# Patient Record
Sex: Male | Born: 1945
Health system: Southern US, Community
[De-identification: ages and names within clinical notes are randomized; demographics above are authoritative.]

## PROBLEM LIST (undated history)

## (undated) DIAGNOSIS — C801 Malignant (primary) neoplasm, unspecified: Secondary | ICD-10-CM

## (undated) DIAGNOSIS — I779 Disorder of arteries and arterioles, unspecified: Secondary | ICD-10-CM

## (undated) HISTORY — PX: HERNIA REPAIR: SHX51

## (undated) HISTORY — PX: JOINT REPLACEMENT: SHX530

## (undated) HISTORY — PX: BACK SURGERY: SHX140

---

## 2017-03-15 ENCOUNTER — Emergency Department (HOSPITAL_BASED_OUTPATIENT_CLINIC_OR_DEPARTMENT_OTHER)
Admission: EM | Admit: 2017-03-15 | Discharge: 2017-03-15 | Disposition: A | Discharge status: Home / Self Care | Payer: Medicare Other | Attending: Emergency Medicine | Admitting: Emergency Medicine

## 2017-03-15 ENCOUNTER — Encounter (HOSPITAL_BASED_OUTPATIENT_CLINIC_OR_DEPARTMENT_OTHER): Payer: Self-pay | Admitting: Emergency Medicine

## 2017-03-15 DIAGNOSIS — S59911A Unspecified injury of right forearm, initial encounter: Secondary | ICD-10-CM | POA: Diagnosis present

## 2017-03-15 DIAGNOSIS — S41151A Open bite of right upper arm, initial encounter: Secondary | ICD-10-CM | POA: Diagnosis not present

## 2017-03-15 DIAGNOSIS — W540XXA Bitten by dog, initial encounter: Secondary | ICD-10-CM | POA: Diagnosis not present

## 2017-03-15 DIAGNOSIS — Z23 Encounter for immunization: Secondary | ICD-10-CM | POA: Insufficient documentation

## 2017-03-15 DIAGNOSIS — Y929 Unspecified place or not applicable: Secondary | ICD-10-CM | POA: Insufficient documentation

## 2017-03-15 DIAGNOSIS — F17210 Nicotine dependence, cigarettes, uncomplicated: Secondary | ICD-10-CM | POA: Diagnosis not present

## 2017-03-15 DIAGNOSIS — Y939 Activity, unspecified: Secondary | ICD-10-CM | POA: Insufficient documentation

## 2017-03-15 DIAGNOSIS — Y999 Unspecified external cause status: Secondary | ICD-10-CM | POA: Diagnosis not present

## 2017-03-15 HISTORY — DX: Malignant (primary) neoplasm, unspecified: C80.1

## 2017-03-15 MED ORDER — TETANUS-DIPHTH-ACELL PERTUSSIS 5-2.5-18.5 LF-MCG/0.5 IM SUSP
0.5000 mL | Freq: Once | INTRAMUSCULAR | Status: AC
  Administered 2017-03-15: 0.5 mL via INTRAMUSCULAR
  Filled 2017-03-15: qty 0.5

## 2017-03-15 MED ORDER — AMOXICILLIN-POT CLAVULANATE 875-125 MG PO TABS: 1.0000 | ORAL_TABLET | Freq: Two times a day (BID) | ORAL | 0 refills | Status: AC

## 2017-03-15 MED FILL — AMOX-CLAV 875-125 MG TABLET: 875-125 | 5 days supply | Qty: 10 | Fill #0

## 2017-03-15 NOTE — Discharge Instructions (Signed)
Please see the information and instructions below regarding your visit.  Your diagnoses today include:  1. Dog bite, initial encounter    Today we irrigated your wound and updated your tetanus shot. It is important may continue to take the antibiotic to prevent any infection from developing in this wound.  Tests performed today include: See side panel of your discharge paperwork for testing performed today. Vital signs are listed at the bottom of these instructions.   Medications prescribed:    1. Augmentin. Please take all of your antibiotics until finished.   You may develop abdominal discomfort or nausea from the antibiotic. If this occurs, you may take it with food. Some patients also get diarrhea with antibiotics. You may help offset this with probiotics which you can buy or get in yogurt. Do not eat or take the probiotics until 2 hours after your antibiotic.   Some people develop allergies to antibiotics. Symptoms of antibiotic allergy can be mild and include a flat rash and itching. They can also be more serious and include:  ?Hives - Hives are raised, red patches of skin that are usually very itchy.  ?Lip or tongue swelling  ?Trouble swallowing or breathing  ?Blistering of the skin or mouth.  If you have any of these serious symptoms, please seek emergency medical care immediately.    Take any prescribed medications only as prescribed, and any over the counter medications only as directed on the packaging.  Home care instructions:  Please follow any educational materials contained in this packet.   Please wash your wound with mild soap and water only. Please pattern dry after it is wet, and redress with a dry dressing. If there is pain or swelling, you may apply ice on top of the bandage.  Follow-up instructions: Please follow-up with your primary care provider in 2 days for further evaluation of your symptoms if they are not completely improved. Please have this wound  looked at early next week.  Return instructions:  Please return to the Emergency Department if you experience worsening symptoms.  Please return for any increasing redness around your wound, pus, increased bleeding, numbness or tingling down her arm, increased swelling, or weakness in your arm. Please return if you have any other emergent concerns.  Additional Information: Please have your blood pressure rechecked at your upcoming visit.  Your vital signs today were: BP (!) 175/79 (BP Location: Left Arm)    Pulse 78    Temp 98 F (36.7 C) (Oral)    Resp 18    Ht 6\' 4"  (1.93 m)    Wt 96.2 kg (212 lb)    SpO2 100%    BMI 25.81 kg/m  If your blood pressure (BP) was elevated on multiple readings during this visit above 130 for the top number or above 80 for the bottom number, please have this repeated by your primary care provider within one month. --------------  Thank you for allowing [place] to participate in your care today.

## 2017-03-15 NOTE — ED Provider Notes (Signed)
[facility] Provider Note   CSN: [id number] Arrival date & time: 03/15/17  1027     History   Chief Complaint Chief Complaint  Patient presents with  . Animal Bite    HPI Danny Hall is a 71 y.o. male.  HPI   Patient is a 71 year old male presenting for a dog bite a known dog earlier this morning. Patient reports that this was an unprovoked attack. Patient reports that he has known this dog for approximately 3 weeks but was suddenly bitten on the right arm and the dog knocked patient down. Associated injuries include abrasions on the left arm, but no other trauma during this episode. Patient does not take blood thinners. Patient denies any swelling, numbness, tingling, or weakness down the right arm. Patient is not a diabetic. Patient does not recall last tetanus shot.   . Diagnosis Date  . Cancer ([place])     There are no active problems to display for this patient.   Past Surgical History:  Procedure Laterality Date  . BACK SURGERY    . HERNIA REPAIR         Home Medications    Prior to Admission medications   Not on File    Family History History reviewed. No pertinent family history.  Social History Social History  Substance Use Topics  . Smoking status: Current Every Day Smoker    Packs/day: 2.00    Types: Cigarettes  . Smokeless tobacco: Never Used  . Alcohol use No     Allergies   Patient has no known allergies.   Review of Systems Review of Systems  Constitutional: Negative for chills and fever.  Respiratory: Negative for shortness of breath.   Cardiovascular: Negative for chest pain.  Musculoskeletal: Negative for joint swelling.  Skin: Positive for wound.  Neurological: Negative for weakness and numbness.     Physical Exam Updated Vital Signs BP (!) 175/79 (BP Location: Left Arm)   Pulse 78   Temp 98 F (36.7 C) (Oral)   Resp 18   Ht 6\' 4"  (1.93 m)   Wt 96.2 kg (212 lb)   SpO2 100%   BMI 25.81 kg/m    Physical Exam  Constitutional: He appears well-developed and well-nourished. No distress.  Sitting comfortably in bed.  HENT:  Head: Normocephalic and atraumatic.  Eyes: Conjunctivae are normal. Right eye exhibits no discharge. Left eye exhibits no discharge.  EOMs normal to gross examination.  Neck: Normal range of motion.  Cardiovascular: Normal rate and regular rhythm.   Intact, 2+ radial pulse on right.   Pulmonary/Chest:  Normal respiratory effort. Patient converses comfortably. No audible wheeze or stridor.  Abdominal: He exhibits no distension.  Musculoskeletal: Normal range of motion.  Neurological: He is alert.  Cranial nerves intact to gross observation. Patient moves extremities without difficulty.  Skin: Skin is warm and dry. He is not diaphoretic.  Approximately 2 cm area of avulsed skin down to fascial layer of antecubital fossa. Brachial artery intact. Sensation to light touch intact in median, ulnar, radial nerve distributions. Grip strength 5 out of 5, and wrist extension 5 out of 5 on right.   Psychiatric: He has a normal mood and affect. His behavior is normal. Judgment and thought content normal.  Nursing note and vitals reviewed.    ED Treatments / Results  Labs (all labs ordered are listed, but only abnormal results are displayed) Labs Reviewed - No data to display  EKG  EKG Interpretation None  Radiology No results found.  Procedures Procedures (including critical care time)  Medications Ordered in ED Medications - No data to display   Initial Impression / Assessment and Plan / ED Course  I have reviewed the triage vital signs and the nursing notes.  Pertinent labs & imaging results that were available during my care of the patient were reviewed by me and considered in my medical decision making (see chart for details).     Final Clinical Impressions(s) / ED Diagnoses   Final diagnoses:  None   Patient is a 71 year old male  presenting for a dog bite a known dog earlier this morning. This animal has had full vaccination series for rabies. Wound cleaned in emergency department today with 500 cc of NS. Exam was not suggestive of neurovascular compromise due to this bite. Patient is otherwise well-appearing. Tetanus updated in the emergency department today and prophylactic Augmentin prescribed. Return precautions given for any spreading erythema, streaking of the arm, purulent drainage, numbness or tingling in the distal right hand, weakness in the right arm, or fever or chills in the setting of this wound. Patient is in understanding and agrees with the plan of care.   This is a shared visit with Dr. Jola Schmidt. Patient was independently evaluated by this attending physician. Attending physician consulted in evaluation and discharge management.  Nursing notes reviewed. Vital signs reviewed. All questions answered by patient.  New Prescriptions New Prescriptions   No medications on file     Tamala Julian 03/15/17 Alfredia Ferguson, MD 03/16/17 [phone]

## 2017-03-15 NOTE — ED Triage Notes (Addendum)
Reports friends dog got out of the car and bit patient.  Open puncture wound noted to right upper arm.  No active bleeding.

## 2017-03-19 ENCOUNTER — Encounter (HOSPITAL_BASED_OUTPATIENT_CLINIC_OR_DEPARTMENT_OTHER): Payer: Self-pay | Admitting: Emergency Medicine

## 2017-03-19 ENCOUNTER — Emergency Department (HOSPITAL_BASED_OUTPATIENT_CLINIC_OR_DEPARTMENT_OTHER)
Admission: EM | Admit: 2017-03-19 | Discharge: 2017-03-19 | Disposition: A | Discharge status: Home / Self Care | Payer: Medicare Other | Attending: Emergency Medicine | Admitting: Emergency Medicine

## 2017-03-19 ENCOUNTER — Emergency Department (HOSPITAL_BASED_OUTPATIENT_CLINIC_OR_DEPARTMENT_OTHER): Payer: Medicare Other

## 2017-03-19 DIAGNOSIS — F17210 Nicotine dependence, cigarettes, uncomplicated: Secondary | ICD-10-CM | POA: Diagnosis not present

## 2017-03-19 DIAGNOSIS — W540XXA Bitten by dog, initial encounter: Secondary | ICD-10-CM | POA: Diagnosis not present

## 2017-03-19 DIAGNOSIS — Z79899 Other long term (current) drug therapy: Secondary | ICD-10-CM | POA: Diagnosis not present

## 2017-03-19 DIAGNOSIS — Z859 Personal history of malignant neoplasm, unspecified: Secondary | ICD-10-CM | POA: Insufficient documentation

## 2017-03-19 DIAGNOSIS — Z4801 Encounter for change or removal of surgical wound dressing: Secondary | ICD-10-CM | POA: Diagnosis not present

## 2017-03-19 DIAGNOSIS — Z5189 Encounter for other specified aftercare: Secondary | ICD-10-CM

## 2017-03-19 MED ORDER — MUPIROCIN CALCIUM 2 % EX CREA: 1.0000 "application " | TOPICAL_CREAM | Freq: Two times a day (BID) | CUTANEOUS | 0 refills | Status: AC

## 2017-03-19 NOTE — ED Triage Notes (Signed)
"   I was here for a dog bite on Monday and this am it has gotten sore and draining" Swelling to RAC area, taking antibiotics as prescribed

## 2017-03-19 NOTE — Discharge Instructions (Signed)
Your exam and ultrasound today did not show evidence of deep infection. It appears to be in the process of healing. We are adding a topical cream antibiotic to help with your antibiotics you are already taking. Please follow-up with your primary care physician for further management. If any symptoms change or worsen, please return to the nearest emergency department.

## 2017-03-19 NOTE — ED Provider Notes (Signed)
[facility] Provider Note   CSN: [id number] Arrival date & time: 03/19/17  0757     History   Chief Complaint Chief Complaint  Patient presents with  . Wound Check    HPI Danny Hall is a 71 y.o. male.  The history is provided by the patient and medical records.  Wound Check  This is a new problem. The current episode started more than 2 days ago. The problem occurs constantly. The problem has been gradually worsening. Pertinent negatives include no chest pain, no abdominal pain, no headaches and no shortness of breath. Nothing aggravates the symptoms. Nothing relieves the symptoms. He has tried nothing for the symptoms. The treatment provided no relief.    Past Medical History:  Diagnosis Date  . Cancer ([place])     There are no active problems to display for this patient.   Past Surgical History:  Procedure Laterality Date  . BACK SURGERY    . HERNIA REPAIR         Home Medications    Prior to Admission medications   Medication Sig Start Date End Date Taking? Authorizing Provider  amoxicillin-clavulanate (AUGMENTIN) 875-125 MG tablet Take 1 tablet by mouth every 12 (twelve) hours. 03/15/17 03/20/17 Yes Albesa Seen, PA-C    Family History No family history on file.  Social History Social History  Substance Use Topics  . Smoking status: Current Every Day Smoker    Packs/day: 2.00    Types: Cigarettes  . Smokeless tobacco: Never Used  . Alcohol use No     Allergies   Patient has no known allergies.   Review of Systems Review of Systems  Constitutional: Negative for chills, diaphoresis, fatigue and fever.  HENT: Negative for congestion and rhinorrhea.   Respiratory: Negative for chest tightness, shortness of breath and wheezing.   Cardiovascular: Negative for chest pain, palpitations and leg swelling.  Gastrointestinal: Negative for abdominal pain, constipation, diarrhea, nausea and vomiting.  Genitourinary: Negative for dysuria.    Musculoskeletal: Negative for back pain, neck pain and neck stiffness.  Skin: Positive for rash and wound.  Neurological: Negative for light-headedness and headaches.  Psychiatric/Behavioral: Negative for agitation and confusion.  All other systems reviewed and are negative.    Physical Exam Updated Vital Signs BP (!) 156/65 (BP Location: Left Arm)   Pulse 72   Temp 98.1 F (36.7 C) (Oral)   Resp 18   Ht 6\' 4"  (1.93 m)   Wt 96.2 kg (212 lb)   SpO2 100%   BMI 25.81 kg/m   Physical Exam  Constitutional: He appears well-developed and well-nourished. No distress.  HENT:  Head: Normocephalic and atraumatic.  Mouth/Throat: Oropharynx is clear and moist. No oropharyngeal exudate.  Eyes: Conjunctivae are normal.  Neck: Neck supple.  Cardiovascular: Normal rate and regular rhythm.   No murmur heard. Pulmonary/Chest: Effort normal and breath sounds normal. No respiratory distress. He has no wheezes. He has no rales. He exhibits no tenderness.  Abdominal: Soft. There is no tenderness.  Musculoskeletal: He exhibits tenderness. He exhibits no edema.       Right elbow: He exhibits laceration. He exhibits normal range of motion, no effusion and no deformity. Tenderness found.       Arms: Neurological: He is alert. No sensory deficit.  Skin: Skin is warm and dry. Capillary refill takes less than 2 seconds. He is not diaphoretic. There is erythema. No pallor.  Psychiatric: He has a normal mood and affect.  Nursing note and vitals  reviewed.    ED Treatments / Results  Labs (all labs ordered are listed, but only abnormal results are displayed) Labs Reviewed - No data to display  EKG  EKG Interpretation None       Radiology Dg Elbow Complete Right  Result Date: 03/19/2017 CLINICAL DATA:  Dog bite 4 days ago. EXAM: RIGHT ELBOW - COMPLETE 3+ VIEW COMPARISON:  None. FINDINGS: There is no evidence of fracture, dislocation, or joint effusion. There is no evidence of arthropathy or  other focal bone abnormality. Soft tissues are unremarkable. IMPRESSION: Negative. Electronically Signed   By: Rolm Baptise M.D.   On: 03/19/2017 09:22    Procedures Procedures (including critical care time)  EMERGENCY DEPARTMENT US SOFT TISSUE INTERPRETATION "Study: Limited Soft Tissue Ultrasound"  INDICATIONS: Soft tissue infection Multiple views of the body part were obtained in real-time with a multi-frequency linear probe  PERFORMED BY: Myself IMAGES ARCHIVED?: Yes SIDE:Right  BODY PART:Upper extremity INTERPRETATION:  No abcess noted and No cellulitis noted     Medications Ordered in ED Medications - No data to display   Initial Impression / Assessment and Plan / ED Course  I have reviewed the triage vital signs and the nursing notes.  Pertinent labs & imaging results that were available during my care of the patient were reviewed by me and considered in my medical decision making (see chart for details).     Danny Hall is a 71 y.o. male with a past medical history significant for left antecubital fossa dog bite 4 days ago who presents with arm pain. Patient says that he was bit by dog 4 days ago and seen at this facility. Patient was started on Augmentin and he has been taking them. He says that he was having no trouble with the arm until today when he noticed swelling, more redness, and pain. Patient denies systemic signs of infection such as fevers, chills, nausea, vomiting, or other complaints. He denies any pain in his hand or distal to the bites. He denies arm swelling distal to the bite. He denies any numbness, tingling, or weakness of the arm. He denies other complaints.  On exam, patient has multiple laceration areas on his right antecubital fossa. There is some surrounding erythema and very mild tenderness. No significant swelling or bulge is seen. Patient has symmetric and normal radial and ulnar pulses. Normal grip strength. Normal sensation in all  distributions of the hand. Lungs clear. Mild pain with elbow manipulation.   Patient says that he did not get imaging during his last visit, will obtain x-ray to look for retained foreign body or subcutaneous air. Patient will also have bedside ultrasound performed to look for abscess.  Anticipate reassessment after imaging.  9:54 AM X-ray shows no evidence of subcutaneous air or retained foreign body. No evidence of osteo-myelitis.  Bedside ultrasound was performed showing no evidence of abscess.  Suspect continued healing of dog bite. Pt has no signs of systemic infection or worsening infection. Patient will have mupirocin cream added to the Augmentin and he will continue this. Patient will follow-up with PCP for reassessment. Patient given return precautions for any new or worsened symptoms. Patient had no other questions or concerns. Patient discharged in good condition.   Final Clinical Impressions(s) / ED Diagnoses   Final diagnoses:  Visit for wound check    New Prescriptions New Prescriptions   MUPIROCIN CREAM (BACTROBAN) 2 %    Apply 1 application topically 2 (two) times daily.    Clinical  Impression: 1. Visit for wound check     Disposition: Discharge  Condition: Good  I have discussed the results, Dx and Tx plan with the pt(& family if present). He/she/they expressed understanding and agree(s) with the plan. Discharge instructions discussed at great length. Strict return precautions discussed and pt &/or family have verbalized understanding of the instructions. No further questions at time of discharge.    New Prescriptions   MUPIROCIN CREAM (BACTROBAN) 2 %    Apply 1 application topically 2 (two) times daily.    Follow Up: [facility] [id number] [phone]     [facility] [place] [facility] DEPARTMENT [address] [id number] [place] [address] [phone]  If symptoms worsen       Tegeler, Gwenyth Allegra, MD 03/19/17 [phone]

## 2017-03-19 NOTE — ED Notes (Signed)
Patient transported to X-ray 

## 2019-04-02 IMAGING — DX DG ELBOW COMPLETE 3+V*R*
4 series · 4 of 4 positions shown · non-contrast
Comparison: None.

CLINICAL DATA: Dog bite 4 days ago.

EXAM:
RIGHT ELBOW - COMPLETE 3+ VIEW

[elbow ap]
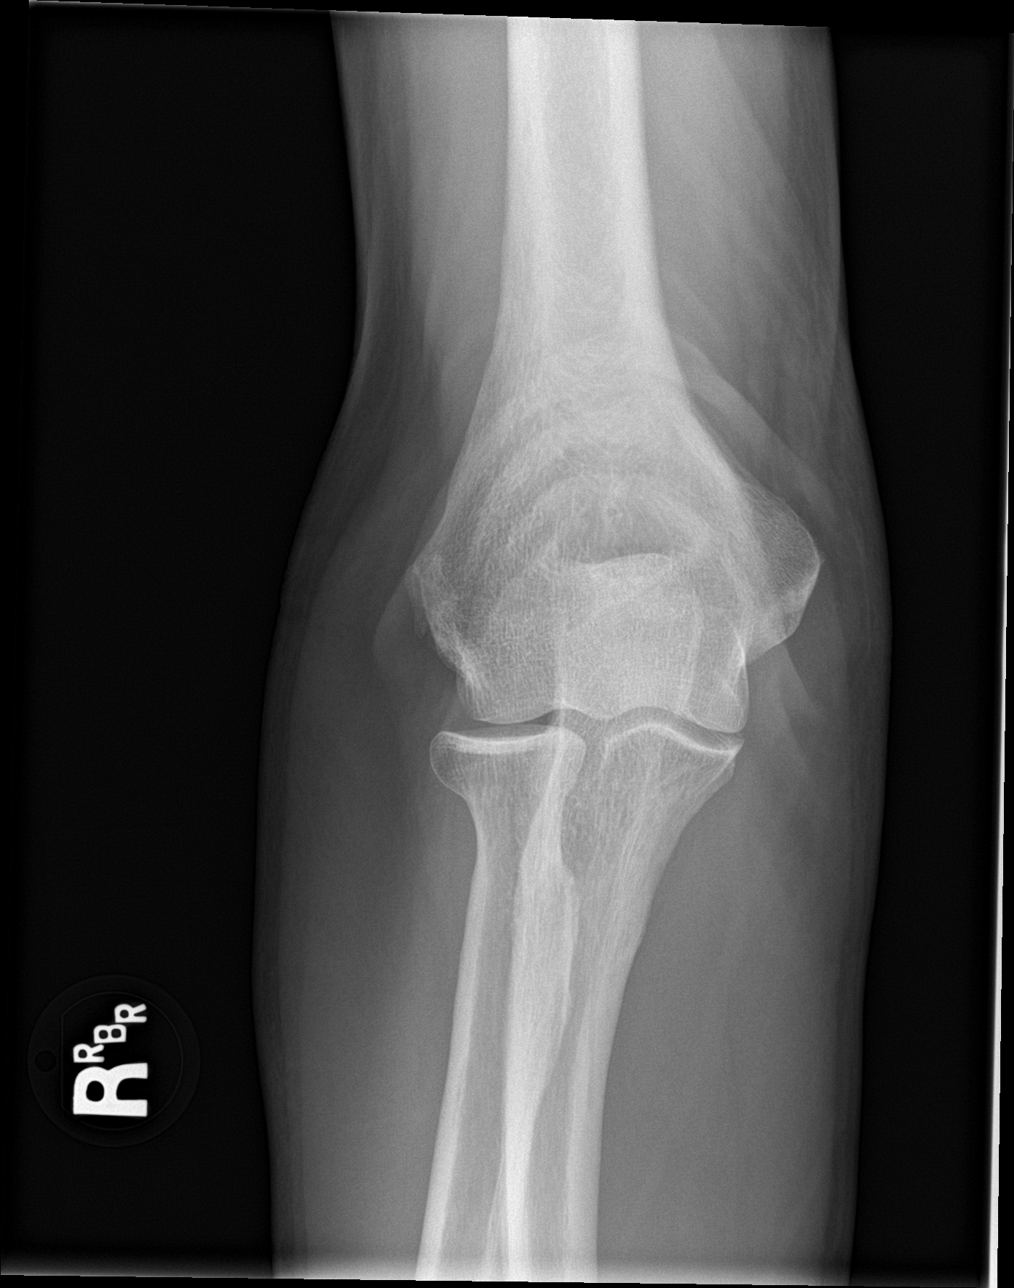

[elbow obl (1 of 2)]
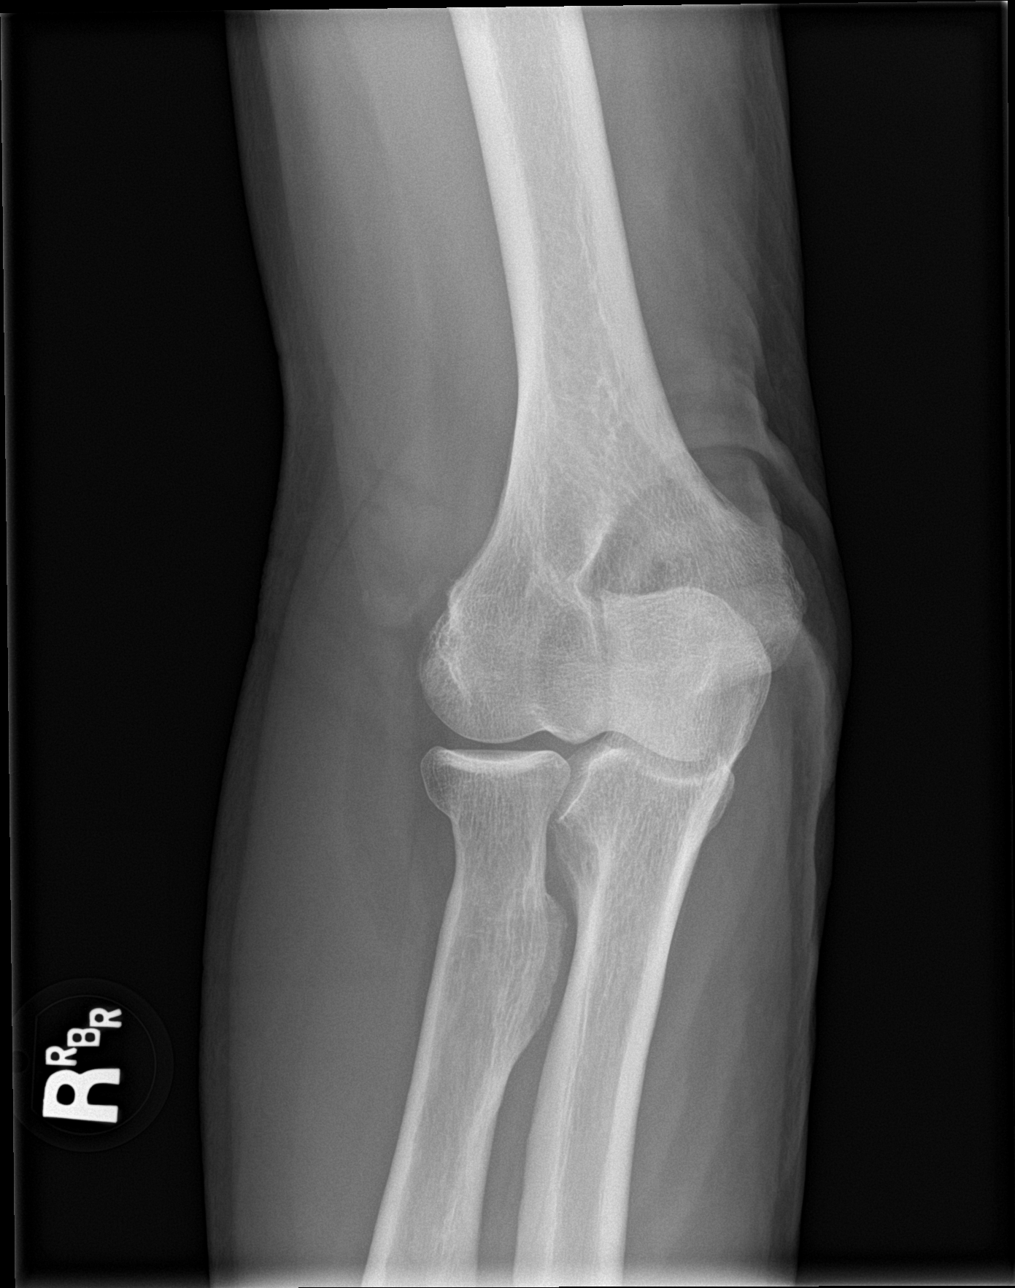

[elbow obl (2 of 2)]
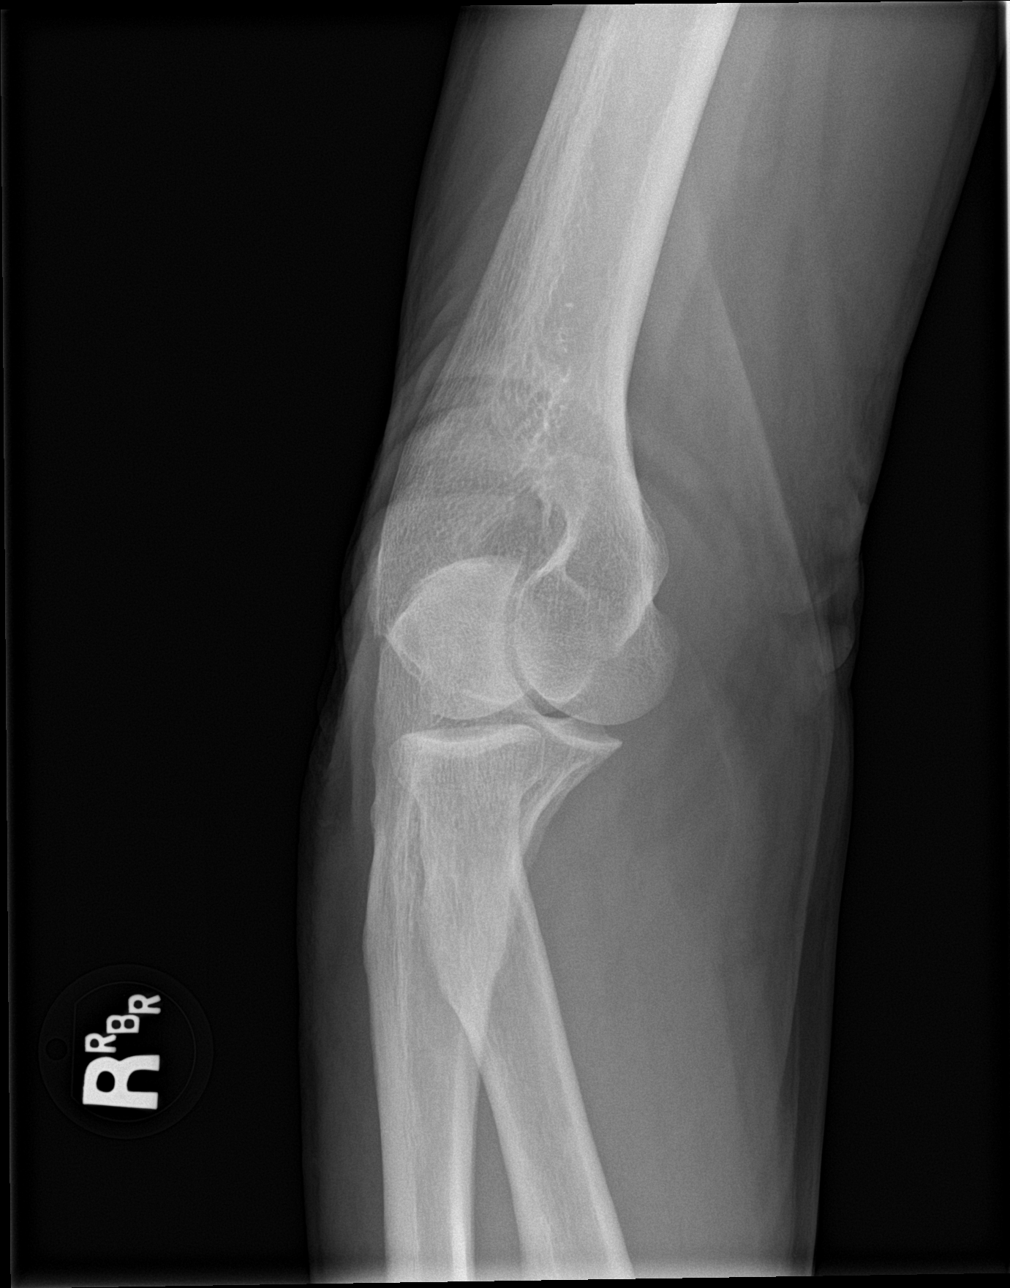

[elbow lat]
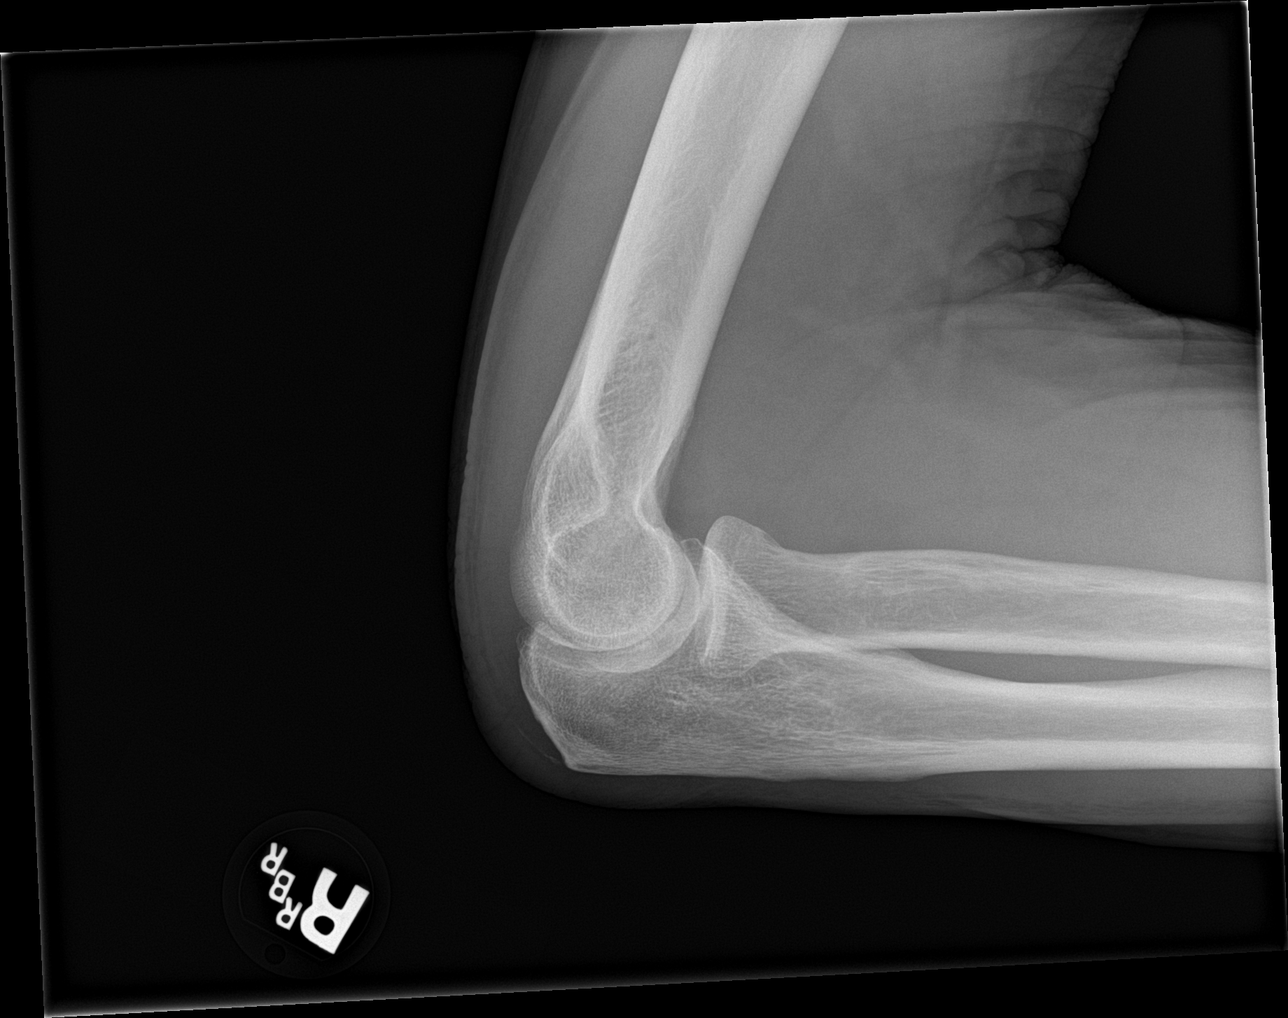

[4 of 4 positions shown; findings below may reference images not displayed]

FINDINGS: There is no evidence of fracture, dislocation, or joint effusion.
There is no evidence of arthropathy or other focal bone abnormality.
Soft tissues are unremarkable.
IMPRESSION: Negative.

## 2020-06-22 DIAGNOSIS — Z20828 Contact with and (suspected) exposure to other viral communicable diseases: Secondary | ICD-10-CM | POA: Diagnosis not present

## 2021-02-05 DIAGNOSIS — J069 Acute upper respiratory infection, unspecified: Secondary | ICD-10-CM | POA: Diagnosis not present

## 2021-02-05 DIAGNOSIS — Z20828 Contact with and (suspected) exposure to other viral communicable diseases: Secondary | ICD-10-CM | POA: Diagnosis not present

## 2021-02-05 DIAGNOSIS — J209 Acute bronchitis, unspecified: Secondary | ICD-10-CM | POA: Diagnosis not present

## 2021-07-22 DIAGNOSIS — I639 Cerebral infarction, unspecified: Secondary | ICD-10-CM

## 2021-07-22 HISTORY — DX: Cerebral infarction, unspecified: I63.9

## 2021-07-23 DIAGNOSIS — I639 Cerebral infarction, unspecified: Secondary | ICD-10-CM

## 2021-07-23 HISTORY — DX: Cerebral infarction, unspecified: I63.9

## 2021-08-05 ENCOUNTER — Ambulatory Visit (HOSPITAL_COMMUNITY)
Admission: RE | Admit: 2021-08-05 | Discharge: 2021-08-05 | Disposition: A | Discharge status: Home / Self Care | Payer: No Typology Code available for payment source | Source: Ambulatory Visit | Attending: Vascular Surgery | Admitting: Vascular Surgery

## 2021-08-05 ENCOUNTER — Other Ambulatory Visit: Payer: Self-pay

## 2021-08-05 ENCOUNTER — Ambulatory Visit (INDEPENDENT_AMBULATORY_CARE_PROVIDER_SITE_OTHER): Payer: No Typology Code available for payment source | Admitting: Vascular Surgery

## 2021-08-05 ENCOUNTER — Other Ambulatory Visit (HOSPITAL_COMMUNITY): Payer: Self-pay | Admitting: Vascular Surgery

## 2021-08-05 ENCOUNTER — Encounter: Payer: Self-pay | Admitting: Vascular Surgery

## 2021-08-05 VITALS — BP 123/78 | HR 85 | Temp 98.3°F | Resp 20 | Ht 76.0 in | Wt 214.6 lb

## 2021-08-05 DIAGNOSIS — I6529 Occlusion and stenosis of unspecified carotid artery: Secondary | ICD-10-CM | POA: Diagnosis present

## 2021-08-05 DIAGNOSIS — I6521 Occlusion and stenosis of right carotid artery: Secondary | ICD-10-CM

## 2021-08-05 MED ORDER — CLOPIDOGREL BISULFATE 75 MG PO TABS: 75.0000 mg | ORAL_TABLET | Freq: Every day | ORAL | 2 refills | Status: AC

## 2021-08-05 NOTE — Progress Notes (Unsigned)
Opened in error

## 2021-08-05 NOTE — Progress Notes (Signed)
[facility] [place] [facility]  ASSESSMENT / PLAN: Danny Hall is a 76 y.o. 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   The patient's carotid artery stenosis merits consideration of revascularization to reduce the risk of future stroke because of carotid stenosis >50% with symptoms of TIA/CVA attributable to carotid lesion. I quoted the patient risk reduction from intervention of ~17% for symptomatic stenosis based on data from the NASCET trial.  The patient should continue best medical therapy for carotid artery stenosis including: Complete cessation from all tobacco products. Blood glucose control with goal A1c < 7%. Blood pressure control with goal blood pressure < 140/90 mmHg. Lipid reduction therapy with goal LDL-C <100 mg/dL (<70 if symptomatic from carotid artery stenosis).  Aspirin 81mg  PO QD.  Clopidogrel 75mg  PO QD. Atorvastatin 40-80mg  PO QD (or other "high intensity" statin therapy).  Patient would benefit from right TCAR given the extent of the disease into the mid internal carotid artery on CT angiography done at the [place].  The reading radiologist transposed the laterality of the disease.  Carotid duplex in my office confirms right-sided severe stenosis. R TCAR 08/14/2021.  We will start Plavix today.  Continue dual antiplatelet therapy until surgery.  CHIEF COMPLAINT: Recent TIA  HISTORY OF PRESENT ILLNESS: Danny Hall is a 76 y.o. male who presents to clinic for evaluation of recent TIA.  The patient reports left-sided numbness in the arm and leg followed by a visual disturbance.  He reported this to his primary care physician who instructed him report to the ER.  Stroke work-up was initiated.  Thankfully his symptoms resolved.  CT angiogram reveals severe right carotid artery stenosis.  He somewhat different symptoms to the vascular surgeon at the [facility].  He was reporting mostly dizziness at that time.  He did not report the visual disturbances  or unilateral numbness.  I think for that reason, the stenosis was not felt to be symptomatic.  Outpatient referral for severe stenosis was recommended.    Past Medical History:  Diagnosis Date   Cancer [facility])     Past Surgical History:  Procedure Laterality Date   BACK SURGERY     HERNIA REPAIR      History reviewed. No pertinent family history.  Social History   Socioeconomic History   Marital status: Married    Spouse name: Not on file   Number of children: Not on file   Years of education: Not on file   Highest education level: Not on file  Occupational History   Not on file  Tobacco Use   Smoking status: Every Day    Packs/day: 2.00    Types: Cigarettes   Smokeless tobacco: Never  Substance and Sexual Activity   Alcohol use: No   Drug use: No   Sexual activity: Not on file  Other Topics Concern   Not on file  Social History Narrative   Not on file   Social Determinants of Health   Financial Resource Strain: Not on file  Food Insecurity: Not on file  Transportation Needs: Not on file  Physical Activity: Not on file  Stress: Not on file  Social Connections: Not on file  Intimate Partner Violence: Not on file    No Known Allergies  Current Outpatient Medications  Medication Sig Dispense Refill   aspirin 325 MG tablet TAKE ONE TABLET BY MOUTH DAILY FOR HEART     atorvastatin (LIPITOR) 40 MG tablet TAKE ONE TABLET BY MOUTH AT BEDTIME  FOR CHOLESTEROL     clopidogrel (PLAVIX) 75 MG tablet Take 1 tablet (75 mg total) by mouth daily. 30 tablet 2   vitamin B-12 (CYANOCOBALAMIN) 500 MCG tablet Take 2 tablets by mouth daily.     nicotine (NICODERM CQ - DOSED IN MG/24 HOURS) 14 mg/24hr patch Place 14 mg onto the skin daily. (Patient not taking: Reported on 08/05/2021)     nicotine (NICODERM CQ - DOSED IN MG/24 HOURS) 21 mg/24hr patch Place 21 mg onto the skin daily. (Patient not taking: Reported on 08/05/2021)     nicotine (NICODERM CQ - DOSED IN MG/24 HR) 7 mg/24hr  patch APPLY 1 PATCH TO SKIN ONCE A DAY FOR NICOTINE REPLACEMENT THERAPY *APPLY TO NON-HAIRY, CLEAN, DRY AREA (NO TOBACCO PRODUCTS)* FOR 2 WEEKS - THIS IS STEP 2 - BEGIN AFTER 21MG  PATCHES COMPLETE (Patient not taking: Reported on 08/05/2021)     No current facility-administered medications for this visit.    PHYSICAL EXAM Vitals:   08/05/21 1340  BP: 123/78  Pulse: 85  Resp: 20  Temp: 98.3 F (36.8 C)  SpO2: 98%  Weight: 214 lb 9.6 oz (97.3 kg)  Height: 6\' 4"  (1.93 m)    Constitutional: well appearing. no distress. Appears well nourished.  Neurologic: CN intact. no focal findings. no sensory loss. Psychiatric:  Mood and affect symmetric and appropriate. Eyes:  No icterus. No conjunctival pallor. Ears, nose, throat:  mucous membranes moist. Midline trachea.  Cardiac: regular rate and rhythm.  Respiratory:  unlabored. Abdominal:  soft, non-tender, non-distended.  Peripheral vascular: 2+ radial pulses Extremity: no edema. no cyanosis. no pallor.  Skin: no gangrene. no ulceration.  Lymphatic: no Stemmer's sign. no palpable lymphadenopathy.  PERTINENT LABORATORY AND RADIOLOGIC DATA Carotid duplex 08/14/21. Personally reviewed.  Right Carotid: Velocities in the right ICA are consistent with a 80-99%                 stenosis.   Left Carotid: Velocities in the left ICA are consistent with a 40-59%  stenosis.   Vertebrals:  Bilateral vertebral arteries demonstrate antegrade flow.  Subclavians: Normal flow hemodynamics were seen in bilateral subclavian               arteries.   [name]. Stanford Breed, MD [facility] Phone Number: [phone] 08/05/2021 5:25 PM  Total time spent on preparing this encounter including chart review, data review, collecting history, examining the patient, coordinating care for this new patient, 60 minutes.  Portions of this report may have been transcribed using voice recognition software.  Every effort has been  made to ensure accuracy; however, inadvertent computerized transcription errors may still be present.

## 2021-08-05 NOTE — H&P (View-Only) (Signed)
[facility] [place]  ASSESSMENT / PLAN: [name] is a 76 y.o. 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   The patient's carotid artery stenosis merits consideration of revascularization to reduce the risk of future stroke because of carotid stenosis >50% with symptoms of TIA/CVA attributable to carotid lesion. I quoted the patient risk reduction from intervention of ~17% for symptomatic stenosis based on data from the NASCET trial.  The patient should continue best medical therapy for carotid artery stenosis including: Complete cessation from all tobacco products. Blood glucose control with goal A1c < 7%. Blood pressure control with goal blood pressure < 140/90 mmHg. Lipid reduction therapy with goal LDL-C <100 mg/dL (<70 if symptomatic from carotid artery stenosis).  Aspirin 81mg  PO QD.  Clopidogrel 75mg  PO QD. Atorvastatin 40-80mg  PO QD (or other "high intensity" statin therapy).  Patient would benefit from right TCAR given the extent of the disease into the mid internal carotid artery on CT angiography done at the [place].  The reading radiologist transposed the laterality of the disease.  Carotid duplex in my office confirms right-sided severe stenosis. R TCAR 08/14/2021.  We will start Plavix today.  Continue dual antiplatelet therapy until surgery.  CHIEF COMPLAINT: Recent TIA  HISTORY OF PRESENT ILLNESS: Danny Hall is a 76 y.o. male who presents to clinic for evaluation of recent TIA.  The patient reports left-sided numbness in the arm and leg followed by a visual disturbance.  He reported this to his primary care physician who instructed him report to the ER.  Stroke work-up was initiated.  Thankfully his symptoms resolved.  CT angiogram reveals severe right carotid artery stenosis.  He somewhat different symptoms to the vascular surgeon at the [facility].  He was reporting mostly dizziness at that time.  He did not report the visual disturbances  or unilateral numbness.  I think for that reason, the stenosis was not felt to be symptomatic.  Outpatient referral for severe stenosis was recommended.    Past Medical History:  Diagnosis Date   Cancer [facility])     Past Surgical History:  Procedure Laterality Date   BACK SURGERY     HERNIA REPAIR      History reviewed. No pertinent family history.  Social History   Socioeconomic History   Marital status: Married    Spouse name: Not on file   Number of children: Not on file   Years of education: Not on file   Highest education level: Not on file  Occupational History   Not on file  Tobacco Use   Smoking status: Every Day    Packs/day: 2.00    Types: Cigarettes   Smokeless tobacco: Never  Substance and Sexual Activity   Alcohol use: No   Drug use: No   Sexual activity: Not on file  Other Topics Concern   Not on file  Social History Narrative   Not on file   Social Determinants of Health   Financial Resource Strain: Not on file  Food Insecurity: Not on file  Transportation Needs: Not on file  Physical Activity: Not on file  Stress: Not on file  Social Connections: Not on file  Intimate Partner Violence: Not on file    No Known Allergies  Current Outpatient Medications  Medication Sig Dispense Refill   aspirin 325 MG tablet TAKE ONE TABLET BY MOUTH DAILY FOR HEART     atorvastatin (LIPITOR) 40 MG tablet TAKE ONE TABLET BY MOUTH AT BEDTIME  FOR CHOLESTEROL     clopidogrel (PLAVIX) 75 MG tablet Take 1 tablet (75 mg total) by mouth daily. 30 tablet 2   vitamin B-12 (CYANOCOBALAMIN) 500 MCG tablet Take 2 tablets by mouth daily.     nicotine (NICODERM CQ - DOSED IN MG/24 HOURS) 14 mg/24hr patch Place 14 mg onto the skin daily. (Patient not taking: Reported on 08/05/2021)     nicotine (NICODERM CQ - DOSED IN MG/24 HOURS) 21 mg/24hr patch Place 21 mg onto the skin daily. (Patient not taking: Reported on 08/05/2021)     nicotine (NICODERM CQ - DOSED IN MG/24 HR) 7 mg/24hr  patch APPLY 1 PATCH TO SKIN ONCE A DAY FOR NICOTINE REPLACEMENT THERAPY *APPLY TO NON-HAIRY, CLEAN, DRY AREA (NO TOBACCO PRODUCTS)* FOR 2 WEEKS - THIS IS STEP 2 - BEGIN AFTER 21MG  PATCHES COMPLETE (Patient not taking: Reported on 08/05/2021)     No current facility-administered medications for this visit.    PHYSICAL EXAM Vitals:   08/05/21 1340  BP: 123/78  Pulse: 85  Resp: 20  Temp: 98.3 F (36.8 C)  SpO2: 98%  Weight: 214 lb 9.6 oz (97.3 kg)  Height: 6\' 4"  (1.93 m)    Constitutional: well appearing. no distress. Appears well nourished.  Neurologic: CN intact. no focal findings. no sensory loss. Psychiatric:  Mood and affect symmetric and appropriate. Eyes:  No icterus. No conjunctival pallor. Ears, nose, throat:  mucous membranes moist. Midline trachea.  Cardiac: regular rate and rhythm.  Respiratory:  unlabored. Abdominal:  soft, non-tender, non-distended.  Peripheral vascular: 2+ radial pulses Extremity: no edema. no cyanosis. no pallor.  Skin: no gangrene. no ulceration.  Lymphatic: no Stemmer's sign. no palpable lymphadenopathy.  PERTINENT LABORATORY AND RADIOLOGIC DATA Carotid duplex 08/14/21. Personally reviewed.  Right Carotid: Velocities in the right ICA are consistent with a 80-99%                 stenosis.   Left Carotid: Velocities in the left ICA are consistent with a 40-59%  stenosis.   Vertebrals:  Bilateral vertebral arteries demonstrate antegrade flow.  Subclavians: Normal flow hemodynamics were seen in bilateral subclavian               arteries.   Danny Hall. Stanford Breed, MD [facility] Phone Number: [phone] 08/05/2021 5:25 PM  Total time spent on preparing this encounter including chart review, data review, collecting history, examining the patient, coordinating care for this new patient, 60 minutes.  Portions of this report may have been transcribed using voice recognition software.  Every effort has been  made to ensure accuracy; however, inadvertent computerized transcription errors may still be present.

## 2021-08-08 NOTE — Progress Notes (Signed)
Surgical Instructions    Your procedure is scheduled on Wednesday, February 23rd, 2023.   Report to [facility] Main Entrance "A" at 05:30 A.M., then check in with the Admitting office.  Call this number if you have problems the morning of surgery:  [phone]   If you have any questions prior to your surgery date call [phone]: Open Monday-Friday 8am-4pm    Remember:  Do not eat or drink after midnight the night before your surgery    Take these medicines the morning of surgery with A SIP OF WATER: NONE   Follow your surgeon's instructions on when to stop Aspirin and Plavix.  If no instructions were given by your surgeon then you will need to call the office to get those instructions.     As of today, STOP taking any Aspirin (unless otherwise instructed by your surgeon) Aleve, Naproxen, Ibuprofen, Motrin, Advil, Goody's, BC's, all herbal medications, fish oil, and all vitamins.    The day of surgery:          Do not wear jewelry  Do not wear lotions, powders, colognes, or deodorant. Men may shave face and neck. Do not bring valuables to the hospital.   [facility] is not responsible for any belongings or valuables. .   Do NOT Smoke (Tobacco/Vaping)  24 hours prior to your procedure  If you use a CPAP at night, you may bring your mask for your overnight stay.   Contacts, glasses, hearing aids, dentures or partials may not be worn into surgery, please bring cases for these belongings   For patients admitted to the hospital, discharge time will be determined by your treatment team.   Patients discharged the day of surgery will not be allowed to drive home, and someone needs to stay with them for 24 hours.  NO VISITORS WILL BE ALLOWED IN PRE-OP WHERE PATIENTS ARE PREPPED FOR SURGERY.  ONLY 1 SUPPORT PERSON MAY BE PRESENT IN THE WAITING ROOM WHILE YOU ARE IN SURGERY.  IF YOU ARE TO BE ADMITTED, ONCE YOU ARE IN YOUR ROOM YOU WILL BE ALLOWED TWO (2) VISITORS. 1 (ONE) VISITOR  MAY STAY OVERNIGHT BUT MUST ARRIVE TO THE ROOM BY 8pm.  Minor children may have two parents present. Special consideration for safety and communication needs will be reviewed on a case by case basis.  Special instructions:    Oral Hygiene is also important to reduce your risk of infection.  Remember - BRUSH YOUR TEETH THE MORNING OF SURGERY WITH YOUR REGULAR TOOTHPASTE   [place]- Preparing For Surgery  Before surgery, you can play an important role. Because skin is not sterile, your skin needs to be as free of germs as possible. You can reduce the number of germs on your skin by washing with CHG (chlorahexidine gluconate) Soap before surgery.  CHG is an antiseptic cleaner which kills germs and bonds with the skin to continue killing germs even after washing.     Please do not use if you have an allergy to CHG or antibacterial soaps. If your skin becomes reddened/irritated stop using the CHG.  Do not shave (including legs and underarms) for at least 48 hours prior to first CHG shower. It is OK to shave your face.  Please follow these instructions carefully.     Shower the NIGHT BEFORE SURGERY and the MORNING OF SURGERY with CHG Soap.   If you chose to wash your hair, wash your hair first as usual with your normal shampoo. After you  shampoo, rinse your hair and body thoroughly to remove the shampoo.  Then [employer] and genitals (private parts) with your normal soap and rinse thoroughly to remove soap.  After that Use CHG Soap as you would any other liquid soap. You can apply CHG directly to the skin and wash gently with a scrungie or a clean washcloth.   Apply the CHG Soap to your body ONLY FROM THE NECK DOWN.  Do not use on open wounds or open sores. Avoid contact with your eyes, ears, mouth and genitals (private parts). Wash Face and genitals (private parts)  with your normal soap.   Wash thoroughly, paying special attention to the area where your surgery will be performed.  Thoroughly  rinse your body with warm water from the neck down.  DO NOT shower/wash with your normal soap after using and rinsing off the CHG Soap.  Pat yourself dry with a CLEAN TOWEL.  Wear CLEAN PAJAMAS to bed the night before surgery  Place CLEAN SHEETS on your bed the night before your surgery  DO NOT SLEEP WITH PETS.   Day of Surgery:  Take a shower with CHG soap. Wear Clean/Comfortable clothing the morning of surgery Do not apply any deodorants/lotions.   Remember to brush your teeth WITH YOUR REGULAR TOOTHPASTE.    COVID testing  If you are going to stay overnight or be admitted after your procedure/surgery and require a pre-op COVID test, please follow these instructions after your COVID test   You are not required to quarantine however you are required to wear a well-fitting mask when you are out and around people not in your household.  If your mask becomes wet or soiled, replace with a new one.  Wash your hands often with soap and water for 20 seconds or clean your hands with an alcohol-based hand sanitizer that contains at least 60% alcohol.  Do not share personal items.  Notify your provider: if you are in close contact with someone who has COVID  or if you develop a fever of 100.4 or greater, sneezing, cough, sore throat, shortness of breath or body aches.    Please read over the following fact sheets that you were given.

## 2021-08-11 ENCOUNTER — Encounter (HOSPITAL_COMMUNITY)
Admission: RE | Admit: 2021-08-11 | Discharge: 2021-08-11 | Disposition: A | Discharge status: Home / Self Care | Payer: No Typology Code available for payment source | Source: Ambulatory Visit | Attending: Vascular Surgery | Admitting: Vascular Surgery

## 2021-08-11 ENCOUNTER — Other Ambulatory Visit: Payer: Self-pay

## 2021-08-11 ENCOUNTER — Encounter (HOSPITAL_COMMUNITY): Payer: Self-pay

## 2021-08-11 VITALS — BP 163/81 | HR 93 | Temp 97.8°F | Resp 18 | Ht 76.0 in | Wt 214.6 lb

## 2021-08-11 DIAGNOSIS — Z20822 Contact with and (suspected) exposure to covid-19: Secondary | ICD-10-CM | POA: Insufficient documentation

## 2021-08-11 DIAGNOSIS — Z01818 Encounter for other preprocedural examination: Secondary | ICD-10-CM | POA: Insufficient documentation

## 2021-08-11 DIAGNOSIS — I6521 Occlusion and stenosis of right carotid artery: Secondary | ICD-10-CM | POA: Diagnosis not present

## 2021-08-11 HISTORY — DX: Disorder of arteries and arterioles, unspecified: I77.9

## 2021-08-11 LAB — COMPREHENSIVE METABOLIC PANEL
ALT: 23 U/L (ref 0–44)
AST: 20 U/L (ref 15–41)
Albumin: 4.1 g/dL (ref 3.5–5.0)
Alkaline Phosphatase: 84 U/L (ref 38–126)
Anion gap: 8 (ref 5–15)
BUN: 18 mg/dL (ref 8–23)
CO2: 24 mmol/L (ref 22–32)
Calcium: 9.6 mg/dL (ref 8.9–10.3)
Chloride: 104 mmol/L (ref 98–111)
Creatinine, Ser: 0.82 mg/dL (ref 0.61–1.24)
GFR, Estimated: >60 mL/min (ref >60)
Glucose, Bld: 92 mg/dL (ref 70–99)
Potassium: 4.2 mmol/L (ref 3.5–5.1)
Sodium: 136 mmol/L (ref 135–145)
Total Bilirubin: 0.5 mg/dL (ref 0.3–1.2)
Total Protein: 7.5 g/dL (ref 6.5–8.1)

## 2021-08-11 LAB — URINALYSIS, ROUTINE W REFLEX MICROSCOPIC
Bilirubin Urine: NEGATIVE
Glucose, UA: NEGATIVE mg/dL
Hgb urine dipstick: NEGATIVE
Ketones, ur: NEGATIVE mg/dL
Leukocytes,Ua: NEGATIVE
Nitrite: NEGATIVE
Protein, ur: NEGATIVE mg/dL
Specific Gravity, Urine: 1.02 (ref 1.005–1.030)
pH: 5 (ref 5.0–8.0)

## 2021-08-11 LAB — TYPE AND SCREEN
ABO/RH(D): O POS
Antibody Screen: NEGATIVE

## 2021-08-11 LAB — CBC
HCT: 51.4 % (ref 39.0–52.0)
Hemoglobin: 16.6 g/dL (ref 13.0–17.0)
MCH: 27.9 pg (ref 26.0–34.0)
MCHC: 32.3 g/dL (ref 30.0–36.0)
MCV: 86.2 fL (ref 80.0–100.0)
Platelets: 223 10*3/uL (ref 150–400)
RBC: 5.96 MIL/uL — ABNORMAL HIGH (ref 4.22–5.81)
RDW: 14 % (ref 11.5–15.5)
WBC: 8.8 10*3/uL (ref 4.0–10.5)
nRBC: 0 % (ref 0.0–0.2)

## 2021-08-11 LAB — PROTIME-INR
INR: 1 (ref 0.8–1.2)
Prothrombin Time: 13.2 seconds (ref 11.4–15.2)

## 2021-08-11 LAB — SURGICAL PCR SCREEN
MRSA, PCR: NEGATIVE
Staphylococcus aureus: NEGATIVE

## 2021-08-11 LAB — SARS CORONAVIRUS 2 (TAT 6-24 HRS): SARS Coronavirus 2: NEGATIVE

## 2021-08-11 LAB — APTT: aPTT: 37 seconds — ABNORMAL HIGH (ref 24–36)

## 2021-08-11 NOTE — Progress Notes (Signed)
PCP - [place] - denies Device Orders -  Rep Notified -   Chest x-ray -  EKG - 08/11/21 Stress Test -  ECHO - requested results from [facility] - none  CPAP -   Fasting Blood Sugar - n/a Checks Blood Sugar _____ times a day  Blood Thinner Instructions:Pt will call Dr. Stanford Breed for instructions regarding when to stop Plavix and Aspirin.  Aspirin Instructions:  ERAS Protcol - PRE-SURGERY Ensure or G2-   COVID TEST- 08/11/21   Anesthesia review: yes- cardiac disease, TIA within one month  Patient denies shortness of breath, fever, cough and chest pain at PAT appointment   All instructions explained to the patient, with a verbal understanding of the material. Patient agrees to go over the instructions while at home for a better understanding. Patient also instructed to wear a mask when out in public after being tested for COVID-19. The opportunity to ask questions was provided.

## 2021-08-11 NOTE — Progress Notes (Signed)
Surgical Instructions    Your procedure is scheduled on Thursday, February 23rd, 2023.   Report to [facility] Main Entrance "A" at 05:30 A.M., then check in with the Admitting office.  Call this number if you have problems the morning of surgery:  [phone]   If you have any questions prior to your surgery date call [phone]: Open Monday-Friday 8am-4pm    Remember:  Do not eat or drink after midnight the night before your surgery    Take these medicines the morning of surgery with A SIP OF WATER: NONE   Follow your surgeon's instructions on when to stop Aspirin and Plavix.  If no instructions were given by your surgeon then you will need to call the office to get those instructions.     As of today, STOP taking any Aspirin (unless otherwise instructed by your surgeon) Aleve, Naproxen, Ibuprofen, Motrin, Advil, Goody's, BC's, all herbal medications, fish oil, and all vitamins.    The day of surgery:          Do not wear jewelry  Do not wear lotions, powders, colognes, or deodorant. Men may shave face and neck. Do not bring valuables to the hospital.   [facility] is not responsible for any belongings or valuables. .   Do NOT Smoke (Tobacco/Vaping)  24 hours prior to your procedure  If you use a CPAP at night, you may bring your mask for your overnight stay.   Contacts, glasses, hearing aids, dentures or partials may not be worn into surgery, please bring cases for these belongings   For patients admitted to the hospital, discharge time will be determined by your treatment team.   Patients discharged the day of surgery will not be allowed to drive home, and someone needs to stay with them for 24 hours.  NO VISITORS WILL BE ALLOWED IN PRE-OP WHERE PATIENTS ARE PREPPED FOR SURGERY.  ONLY 1 SUPPORT PERSON MAY BE PRESENT IN THE WAITING ROOM WHILE YOU ARE IN SURGERY.  IF YOU ARE TO BE ADMITTED, ONCE YOU ARE IN YOUR ROOM YOU WILL BE ALLOWED TWO (2) VISITORS. 1 (ONE) VISITOR  MAY STAY OVERNIGHT BUT MUST ARRIVE TO THE ROOM BY 8pm.  Minor children may have two parents present. Special consideration for safety and communication needs will be reviewed on a case by case basis.  Special instructions:    Oral Hygiene is also important to reduce your risk of infection.  Remember - BRUSH YOUR TEETH THE MORNING OF SURGERY WITH YOUR REGULAR TOOTHPASTE   [place]- Preparing For Surgery  Before surgery, you can play an important role. Because skin is not sterile, your skin needs to be as free of germs as possible. You can reduce the number of germs on your skin by washing with CHG (chlorahexidine gluconate) Soap before surgery.  CHG is an antiseptic cleaner which kills germs and bonds with the skin to continue killing germs even after washing.     Please do not use if you have an allergy to CHG or antibacterial soaps. If your skin becomes reddened/irritated stop using the CHG.  Do not shave (including legs and underarms) for at least 48 hours prior to first CHG shower. It is OK to shave your face.  Please follow these instructions carefully.     Shower the NIGHT BEFORE SURGERY and the MORNING OF SURGERY with CHG Soap.   If you chose to wash your hair, wash your hair first as usual with your normal shampoo. After you  shampoo, rinse your hair and body thoroughly to remove the shampoo.  Then [employer] and genitals (private parts) with your normal soap and rinse thoroughly to remove soap.  After that Use CHG Soap as you would any other liquid soap. You can apply CHG directly to the skin and wash gently with a scrungie or a clean washcloth.   Apply the CHG Soap to your body ONLY FROM THE NECK DOWN.  Do not use on open wounds or open sores. Avoid contact with your eyes, ears, mouth and genitals (private parts). Wash Face and genitals (private parts)  with your normal soap.   Wash thoroughly, paying special attention to the area where your surgery will be performed.  Thoroughly  rinse your body with warm water from the neck down.  DO NOT shower/wash with your normal soap after using and rinsing off the CHG Soap.  Pat yourself dry with a CLEAN TOWEL.  Wear CLEAN PAJAMAS to bed the night before surgery  Place CLEAN SHEETS on your bed the night before your surgery  DO NOT SLEEP WITH PETS.   Day of Surgery:  Take a shower with CHG soap. Wear Clean/Comfortable clothing the morning of surgery Do not apply any deodorants/lotions.   Remember to brush your teeth WITH YOUR REGULAR TOOTHPASTE.    COVID testing  If you are going to stay overnight or be admitted after your procedure/surgery and require a pre-op COVID test, please follow these instructions after your COVID test   You are not required to quarantine however you are required to wear a well-fitting mask when you are out and around people not in your household.  If your mask becomes wet or soiled, replace with a new one.  Wash your hands often with soap and water for 20 seconds or clean your hands with an alcohol-based hand sanitizer that contains at least 60% alcohol.  Do not share personal items.  Notify your provider: if you are in close contact with someone who has COVID  or if you develop a fever of 100.4 or greater, sneezing, cough, sore throat, shortness of breath or body aches.    Please read over the following fact sheets that you were given.

## 2021-08-12 ENCOUNTER — Encounter (HOSPITAL_COMMUNITY): Payer: Self-pay

## 2021-08-12 NOTE — Progress Notes (Signed)
Anesthesia Chart Review:  Case: [id number] Date/Time: 08/14/21 0715   Procedure: Right Transcarotid Artery Revascularization (Right)   Anesthesia type: General   Pre-op diagnosis: CAROTID STENOSIS   Location: [facility] OR ROOM 16 / [place] OR   Surgeons: [name], MD       DISCUSSION: Patient is a 76 year old male scheduled for the above procedure.  History includes smoking, TIA (07/22/21; left sided numbness followed by visual disturbance, resolved; admitted [facility] 07/22/21-07/14/21), cancer, back surgery, left hip THA.   [facility] records in [place] (01/16/21 Primary Care note by Dr. Berniece Salines. [place]) included the additional history of: - RLL lung nodule followed by imaging 2012-2014. Reportedly stable 11/2012 CT and felt to be benign. Dr. Steva Ready advised no follow-up. 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  - Melanoma (s/p excision right upper back 02/20/10). 02/2011 PET scan "ok". Recent shave biopsy showed "nevus". Followed by dermatology. 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 02/2011 PET scan showed no hypermetabolic activity.  Work-up for 07/23/19 TIA included head CT, CTA head/neck, echo, and carotid Duplex. Initial carotid US at the [facility] showed no significant stenosis, but CTA showed high grade 70-80% RICA stenosis. No embolic source noted by echo. He was referred to Dr. Stanford Breed. 08/05/21 carotid US confirmed significant RICA stenosis at 93-73% and moderate LICA stenosis at 42-87%. Recommended ASA 81 mg, Plavix, atorvastatin, and right TCAR. Per Dr. Stanford Breed, he is to continue DPAT until surgery. I called and spoke with him, and he confirmed he is taking. He was unsure whether he was to take on the morning of surgery. I have asked [facility] nursing staff to follow-up with him to clarify. If so, I advised those medications can be taken with sips of water.  08/11/2021 presurgical COVID-19 test negative.  Anesthesia team to evaluate  on the day of surgery.  VS: BP (!) 163/81    Pulse 93    Temp 36.6 C    Resp 18    Ht 6\' 4"  (1.93 m)    Wt 97.3 kg    SpO2 100%    BMI 26.12 kg/m    PROVIDERS: [place]: Labs reviewed: Acceptable for surgery. 07/23/21 A1c 5.6% ([facility]) (all labs ordered are listed, but only abnormal results are displayed)  Labs Reviewed  CBC - Abnormal; Notable for the following components:      Result Value   RBC 5.96 (*)    All other components within normal limits  APTT - Abnormal; Notable for the following components:   aPTT 37 (*)    All other components within normal limits  SURGICAL PCR SCREEN  SARS CORONAVIRUS 2 (TAT 6-24 HRS)  COMPREHENSIVE METABOLIC PANEL  PROTIME-INR  URINALYSIS, ROUTINE W REFLEX MICROSCOPIC  TYPE AND SCREEN     IMAGES: CT Head/Neck 07/22/21 [facility] CE):** Per Dr. Mora Appl 08/05/21 Office Note, "Patient would benefit from right TCAR given the extent of the disease into the mid internal carotid artery on CT angiography done at [facility].  The reading radiologist transposed the laterality of the disease.  Carotid duplex in my office confirms right-sided severe stenosis. R TCAR 08/14/2021." ** Findings included: - There is dense atherosclerotic plaque at the RIGHT common carotid bifurcation, extending into the origin of the RIGHT carotid bulb. There is resultant high-grade stenosis (70-80%) of the RIGHT carotid bulb. (Series 601, coronal image #64, and series 602, sagittal image #54.) The cervical portion of the RIGHT ICA  is patent. The right ECA branches are also patent.  - The LEFT common carotid artery is patent throughout. There is atherosclerotic plaque involving the LEFT common carotid artery, extending to the LEFT carotid bulb. Despite this, there is only 40% luminal stenosis of the LEFT carotid bulb. The cervical portion of  he LEFT ICA and the LEFT ECA branches are patent.  Impression:  CT angiogram of the neck 07/22/21: 1. There is atherosclerotic plaque of  the left common carotid  bifurcation, extending to the left carotid bulb. There is  resultant high-grade stenosis of the left carotid bulb (70-80%).  2. There is atherosclerotic plaque of the right common carotid  bifurcation, extending into the right carotid bulb. However,  there is only 40% luminal stenosis of the right carotid bulb.  3. There is focal plaque and a high-grade stenosis at the origin  of the slightly hypoplastic right vertebral artery. Although  there is atherosclerosis of the extracranial portion of the left  vertebral artery, there is no flow-limiting stenosis of the  extracranial portion of the left vertebral artery.  4. There is multilevel degenerative spondylosis of the cervical  spine, especially at the C4-C5, C5-C6, and C6-C7 levels. Please  see above for characterization.  5. There is moderate pulmonary emphysema noted in the visualized  portion of the lung apices.   CT angiogram of the head 07/22/21: 1. There is no flow-limiting stenosis of the intracranial arteries.  2. There is no intracranial aneurysm.  3. There is no dural sinus thrombosis.    CT Head 07/22/21 [facility]): Impression: 1. No evidence of active intracranial disease.  2. Unremarkable appearance of the mid and upper orbits.  3. Paranasal sinus inflammatory disease.    EKG: 08/11/21: NSR   CV: US Carotid 08/05/21: Summary:  - Right Carotid: Velocities in the right ICA are consistent with a 80-99% stenosis.  - Left Carotid: Velocities in the left ICA are consistent with a 40-59% stenosis.  - Vertebrals:  Bilateral vertebral arteries demonstrate antegrade flow.  - Subclavians: Normal flow hemodynamics were seen in bilateral subclavian arteries.   Echo 07/23/21 ([place]): Interpretation Summary The left ventricular size, thickness and function are normal Ejection Fraction = 60-65% (Visual Estimation).  No regional wall motion abnormalities noted. No diastolic dysfunction parameters to  suggest elevated left atrial pressure or congestive heart failure. The right ventricle is normal in size and function. The interatrial septum is intact with no evidence for an atrial septal defect. Injection of contrast documented no interatrial shunt at rest and with valsalva. There is no evidence of significant valvular disease. (Trace AR, trace PR, trace MR) There is no comparison study available.  ABI 07/23/21 ([place]): Impression: 1. RIGHT: Mild peripheral vascular disease at rest.  2. LEFT: Resting ABI compatible with severe peripheral vascular  disease and resting TBI compatible with moderate peripheral  vascular disease.  3. ABI criteria: > 1.4: Calcified, noncompressible and therefore nondiagnostic. 0.9-1.4: Normal. 0.7-0.89: Mild peripheral vascular disease. 0.51-0.69: Moderate peripheral vascular disease. < or = 0.5: Severe peripheral vascular disease.  - TBI criteria: > 0.6: Normal. 0.34-0.59: Mild peripheral vascular disease. 0.12-0.34: Moderate peripheral vascular disease. < or = 0.11: Severe peripheral vascular disease.    Past Medical History:  Diagnosis Date   Cancer [facility])    Carotid artery disease ([place])    Stroke ([place]) 07/22/2021   TIA    Past Surgical History:  Procedure Laterality Date   BACK SURGERY     HERNIA REPAIR  JOINT REPLACEMENT Left    hip    MEDICATIONS:  aspirin 325 MG tablet   atorvastatin (LIPITOR) 40 MG tablet   clopidogrel (PLAVIX) 75 MG tablet   diclofenac Sodium (VOLTAREN) 1 % GEL   nicotine (NICODERM CQ - DOSED IN MG/24 HOURS) 14 mg/24hr patch   nicotine (NICODERM CQ - DOSED IN MG/24 HOURS) 21 mg/24hr patch   nicotine (NICODERM CQ - DOSED IN MG/24 HR) 7 mg/24hr patch   No current facility-administered medications for this encounter.    Myra Gianotti, PA-C Surgical Short Stay/Anesthesiology [facility] Phone [phone] [facility] Phone [phone] 08/12/2021 5:35 PM

## 2021-08-12 NOTE — Anesthesia Preprocedure Evaluation (Addendum)
Anesthesia Evaluation  Patient identified by MRN, date of birth, ID band Patient awake    Reviewed: Allergy & Precautions, NPO status , Patient's Chart, lab work & pertinent test results  Airway Mallampati: I  TM Distance: >3 FB Neck ROM: Full    Dental  (+) Teeth Intact, Dental Advisory Given   Pulmonary Current Smoker,    breath sounds clear to auscultation       Cardiovascular negative cardio ROS   Rhythm:Regular Rate:Normal     Neuro/Psych CVA, Residual Symptoms    GI/Hepatic negative GI ROS, Neg liver ROS,   Endo/Other  negative endocrine ROS  Renal/GU negative Renal ROS     Musculoskeletal negative musculoskeletal ROS (+)   Abdominal Normal abdominal exam  (+)   Peds  Hematology negative hematology ROS (+)   Anesthesia Other Findings   Reproductive/Obstetrics                           Anesthesia Physical Anesthesia Plan  ASA: 3  Anesthesia Plan: General   Post-op Pain Management:    Induction: Intravenous  PONV Risk Score and Plan: 2 and Ondansetron and Treatment may vary due to age or medical condition  Airway Management Planned: Oral ETT  Additional Equipment: Arterial line  Intra-op Plan:   Post-operative Plan: Extubation in OR  Informed Consent:   Plan Discussed with: CRNA  Anesthesia Plan Comments: (PAT note written 08/12/2021 by Myra Gianotti, PA-C. )       Anesthesia Quick Evaluation

## 2021-08-13 ENCOUNTER — Telehealth: Payer: Self-pay

## 2021-08-13 NOTE — Telephone Encounter (Signed)
Message from Myra Gianotti, PA-C informing that patient had some confusion regarding whether to continue his ASA and Plavix prior to his TCAR on 08/14/21. Contacted patient and and advised he is to continue ASA and Plavix even on the morning of surgery. Patient verbalized understanding.   Patient informed that he has been taking ASA 325 mg prescribed by the [place]. I advised him that since Dr. Stanford Breed started him on Plavix, he was to decrease dose to ASA 81 mg daily. Patient verbalized understanding and stated he will start today.

## 2021-08-14 ENCOUNTER — Inpatient Hospital Stay (HOSPITAL_COMMUNITY): Payer: No Typology Code available for payment source | Admitting: Vascular Surgery

## 2021-08-14 ENCOUNTER — Other Ambulatory Visit: Payer: Self-pay

## 2021-08-14 ENCOUNTER — Inpatient Hospital Stay (HOSPITAL_COMMUNITY): Payer: No Typology Code available for payment source | Admitting: Certified Registered"

## 2021-08-14 ENCOUNTER — Encounter (HOSPITAL_COMMUNITY)
Admission: RE | Disposition: A | Discharge status: Home / Self Care | Payer: Self-pay | Source: Home / Self Care | Attending: Vascular Surgery

## 2021-08-14 ENCOUNTER — Encounter (HOSPITAL_COMMUNITY): Payer: Self-pay | Admitting: Vascular Surgery

## 2021-08-14 ENCOUNTER — Inpatient Hospital Stay (HOSPITAL_COMMUNITY): Payer: No Typology Code available for payment source

## 2021-08-14 ENCOUNTER — Inpatient Hospital Stay (HOSPITAL_COMMUNITY)
Admission: RE | Admit: 2021-08-14 | Discharge: 2021-08-15 | DRG: 036 | Disposition: A | Discharge status: Home / Self Care | Payer: No Typology Code available for payment source | Attending: Vascular Surgery | Admitting: Vascular Surgery

## 2021-08-14 DIAGNOSIS — Z79899 Other long term (current) drug therapy: Secondary | ICD-10-CM | POA: Diagnosis not present

## 2021-08-14 DIAGNOSIS — Z8673 Personal history of transient ischemic attack (TIA), and cerebral infarction without residual deficits: Secondary | ICD-10-CM

## 2021-08-14 DIAGNOSIS — Z7902 Long term (current) use of antithrombotics/antiplatelets: Secondary | ICD-10-CM | POA: Diagnosis not present

## 2021-08-14 DIAGNOSIS — F17210 Nicotine dependence, cigarettes, uncomplicated: Secondary | ICD-10-CM | POA: Diagnosis present

## 2021-08-14 DIAGNOSIS — I6521 Occlusion and stenosis of right carotid artery: Secondary | ICD-10-CM

## 2021-08-14 DIAGNOSIS — Z006 Encounter for examination for normal comparison and control in clinical research program: Secondary | ICD-10-CM | POA: Diagnosis not present

## 2021-08-14 DIAGNOSIS — I6529 Occlusion and stenosis of unspecified carotid artery: Secondary | ICD-10-CM | POA: Diagnosis present

## 2021-08-14 DIAGNOSIS — Z7982 Long term (current) use of aspirin: Secondary | ICD-10-CM | POA: Diagnosis not present

## 2021-08-14 HISTORY — PX: TRANSCAROTID ARTERY REVASCULARIZATIONÂ: SHX6778

## 2021-08-14 LAB — ABO/RH: ABO/RH(D): O POS

## 2021-08-14 LAB — POCT ACTIVATED CLOTTING TIME: Activated Clotting Time: 323 seconds

## 2021-08-14 SURGERY — TRANSCAROTID ARTERY REVASCULARIZATION (TCAR)
Anesthesia: General | Site: Neck | Laterality: Right

## 2021-08-14 MED ORDER — ONDANSETRON HCL 4 MG/2ML IJ SOLN: 4.0000 mg | Freq: Four times a day (QID) | INTRAMUSCULAR | Status: DC | PRN

## 2021-08-14 MED ORDER — FENTANYL CITRATE (PF) 100 MCG/2ML IJ SOLN: 25.0000 ug | INTRAMUSCULAR | Status: DC | PRN

## 2021-08-14 MED ORDER — SUGAMMADEX SODIUM 200 MG/2ML IV SOLN
INTRAVENOUS | Status: DC | PRN
  Administered 2021-08-14: 200 mg via INTRAVENOUS

## 2021-08-14 MED ORDER — PHENYLEPHRINE 40 MCG/ML (10ML) SYRINGE FOR IV PUSH (FOR BLOOD PRESSURE SUPPORT)
PREFILLED_SYRINGE | INTRAVENOUS | Status: AC
  Filled 2021-08-14: qty 10

## 2021-08-14 MED ORDER — PANTOPRAZOLE SODIUM 40 MG PO TBEC
40.0000 mg | DELAYED_RELEASE_TABLET | Freq: Every day | ORAL | Status: DC
  Administered 2021-08-14 – 2021-08-15 (×2): 40 mg via ORAL
  Filled 2021-08-14 (×2): qty 1

## 2021-08-14 MED ORDER — POTASSIUM CHLORIDE CRYS ER 20 MEQ PO TBCR: 20.0000 meq | EXTENDED_RELEASE_TABLET | Freq: Every day | ORAL | Status: DC | PRN

## 2021-08-14 MED ORDER — ONDANSETRON HCL 4 MG/2ML IJ SOLN
INTRAMUSCULAR | Status: AC
  Filled 2021-08-14: qty 2

## 2021-08-14 MED ORDER — GUAIFENESIN-DM 100-10 MG/5ML PO SYRP: 15.0000 mL | ORAL_SOLUTION | ORAL | Status: DC | PRN

## 2021-08-14 MED ORDER — EPHEDRINE SULFATE-NACL 50-0.9 MG/10ML-% IV SOSY
PREFILLED_SYRINGE | INTRAVENOUS | Status: DC | PRN
  Administered 2021-08-14 (×2): 5 mg via INTRAVENOUS

## 2021-08-14 MED ORDER — IODIXANOL 320 MG/ML IV SOLN
INTRAVENOUS | Status: DC | PRN
  Administered 2021-08-14: 20 mL

## 2021-08-14 MED ORDER — ATORVASTATIN CALCIUM 40 MG PO TABS
40.0000 mg | ORAL_TABLET | Freq: Every day | ORAL | Status: DC
  Administered 2021-08-14 – 2021-08-15 (×2): 40 mg via ORAL
  Filled 2021-08-14 (×2): qty 1

## 2021-08-14 MED ORDER — PROTAMINE SULFATE 10 MG/ML IV SOLN
INTRAVENOUS | Status: DC | PRN
  Administered 2021-08-14: 50 mg via INTRAVENOUS

## 2021-08-14 MED ORDER — OXYCODONE-ACETAMINOPHEN 5-325 MG PO TABS: 1.0000 | ORAL_TABLET | ORAL | Status: DC | PRN

## 2021-08-14 MED ORDER — ASPIRIN EC 81 MG PO TBEC
81.0000 mg | DELAYED_RELEASE_TABLET | Freq: Every day | ORAL | Status: DC
  Administered 2021-08-15: 81 mg via ORAL
  Filled 2021-08-14: qty 1

## 2021-08-14 MED ORDER — LIDOCAINE 2% (20 MG/ML) 5 ML SYRINGE
INTRAMUSCULAR | Status: AC
  Filled 2021-08-14: qty 5

## 2021-08-14 MED ORDER — ACETAMINOPHEN 10 MG/ML IV SOLN: 1000.0000 mg | Freq: Once | INTRAVENOUS | Status: DC | PRN

## 2021-08-14 MED ORDER — ROCURONIUM BROMIDE 10 MG/ML (PF) SYRINGE
PREFILLED_SYRINGE | INTRAVENOUS | Status: DC | PRN
  Administered 2021-08-14: 20 mg via INTRAVENOUS
  Administered 2021-08-14: 60 mg via INTRAVENOUS

## 2021-08-14 MED ORDER — MAGNESIUM SULFATE 2 GM/50ML IV SOLN: 2.0000 g | Freq: Every day | INTRAVENOUS | Status: DC | PRN

## 2021-08-14 MED ORDER — ORAL CARE MOUTH RINSE: 15.0000 mL | Freq: Once | OROMUCOSAL | Status: AC

## 2021-08-14 MED ORDER — LABETALOL HCL 5 MG/ML IV SOLN: 10.0000 mg | INTRAVENOUS | Status: DC | PRN

## 2021-08-14 MED ORDER — CHLORHEXIDINE GLUCONATE CLOTH 2 % EX PADS: 6.0000 | MEDICATED_PAD | Freq: Once | CUTANEOUS | Status: DC

## 2021-08-14 MED ORDER — DICLOFENAC SODIUM 1 % EX GEL: 2.0000 g | Freq: Every day | CUTANEOUS | Status: DC | PRN

## 2021-08-14 MED ORDER — PHENYLEPHRINE HCL-NACL 20-0.9 MG/250ML-% IV SOLN
INTRAVENOUS | Status: DC | PRN
  Administered 2021-08-14: 25 ug/min via INTRAVENOUS

## 2021-08-14 MED ORDER — ROCURONIUM BROMIDE 10 MG/ML (PF) SYRINGE
PREFILLED_SYRINGE | INTRAVENOUS | Status: AC
  Filled 2021-08-14: qty 10

## 2021-08-14 MED ORDER — PHENOL 1.4 % MT LIQD: 1.0000 | OROMUCOSAL | Status: DC | PRN

## 2021-08-14 MED ORDER — FENTANYL CITRATE (PF) 100 MCG/2ML IJ SOLN
INTRAMUSCULAR | Status: DC | PRN
Start: 2021-08-14 — End: 2021-08-14
  Administered 2021-08-14: 100 ug via INTRAVENOUS
  Administered 2021-08-14: 50 ug via INTRAVENOUS

## 2021-08-14 MED ORDER — MORPHINE SULFATE (PF) 2 MG/ML IV SOLN: 2.0000 mg | INTRAVENOUS | Status: DC | PRN

## 2021-08-14 MED ORDER — HEPARIN SODIUM (PORCINE) 1000 UNIT/ML IJ SOLN
INTRAMUSCULAR | Status: DC | PRN
  Administered 2021-08-14: 10000 [IU] via INTRAVENOUS

## 2021-08-14 MED ORDER — AMISULPRIDE (ANTIEMETIC) 5 MG/2ML IV SOLN: 10.0000 mg | Freq: Once | INTRAVENOUS | Status: DC | PRN

## 2021-08-14 MED ORDER — CEFAZOLIN SODIUM-DEXTROSE 2-4 GM/100ML-% IV SOLN
2.0000 g | Freq: Three times a day (TID) | INTRAVENOUS | Status: AC
  Administered 2021-08-14 – 2021-08-15 (×2): 2 g via INTRAVENOUS
  Filled 2021-08-14 (×2): qty 100

## 2021-08-14 MED ORDER — CEFAZOLIN SODIUM-DEXTROSE 2-4 GM/100ML-% IV SOLN
2.0000 g | INTRAVENOUS | Status: AC
  Administered 2021-08-14: 2 g via INTRAVENOUS
  Filled 2021-08-14: qty 100

## 2021-08-14 MED ORDER — CHLORHEXIDINE GLUCONATE 0.12 % MT SOLN
15.0000 mL | Freq: Once | OROMUCOSAL | Status: AC
  Administered 2021-08-14: 15 mL via OROMUCOSAL
  Filled 2021-08-14: qty 15

## 2021-08-14 MED ORDER — HEPARIN 6000 UNIT IRRIGATION SOLUTION
Status: DC | PRN
  Administered 2021-08-14: 1

## 2021-08-14 MED ORDER — LIDOCAINE HCL (PF) 1 % IJ SOLN
INTRAMUSCULAR | Status: AC
  Filled 2021-08-14: qty 5

## 2021-08-14 MED ORDER — HYDRALAZINE HCL 20 MG/ML IJ SOLN: 5.0000 mg | INTRAMUSCULAR | Status: DC | PRN

## 2021-08-14 MED ORDER — SODIUM CHLORIDE 0.9 % IV SOLN: INTRAVENOUS | Status: DC

## 2021-08-14 MED ORDER — DEXAMETHASONE SODIUM PHOSPHATE 10 MG/ML IJ SOLN
INTRAMUSCULAR | Status: DC | PRN
  Administered 2021-08-14: 5 mg via INTRAVENOUS

## 2021-08-14 MED ORDER — PROMETHAZINE HCL 25 MG/ML IJ SOLN: 6.2500 mg | INTRAMUSCULAR | Status: DC | PRN

## 2021-08-14 MED ORDER — CLOPIDOGREL BISULFATE 75 MG PO TABS
75.0000 mg | ORAL_TABLET | Freq: Every day | ORAL | Status: DC
  Administered 2021-08-15: 75 mg via ORAL
  Filled 2021-08-14: qty 1

## 2021-08-14 MED ORDER — PROPOFOL 10 MG/ML IV BOLUS
INTRAVENOUS | Status: AC
  Filled 2021-08-14: qty 20

## 2021-08-14 MED ORDER — ACETAMINOPHEN 325 MG PO TABS
325.0000 mg | ORAL_TABLET | ORAL | Status: DC | PRN
  Administered 2021-08-14: 650 mg via ORAL
  Filled 2021-08-14: qty 2

## 2021-08-14 MED ORDER — DOCUSATE SODIUM 100 MG PO CAPS
100.0000 mg | ORAL_CAPSULE | Freq: Every day | ORAL | Status: DC
  Filled 2021-08-14: qty 1

## 2021-08-14 MED ORDER — POLYETHYLENE GLYCOL 3350 17 G PO PACK: 17.0000 g | PACK | Freq: Every day | ORAL | Status: DC | PRN

## 2021-08-14 MED ORDER — METOPROLOL TARTRATE 5 MG/5ML IV SOLN: 2.0000 mg | INTRAVENOUS | Status: DC | PRN

## 2021-08-14 MED ORDER — ACETAMINOPHEN 650 MG RE SUPP: 325.0000 mg | RECTAL | Status: DC | PRN

## 2021-08-14 MED ORDER — GLYCOPYRROLATE PF 0.2 MG/ML IJ SOSY
PREFILLED_SYRINGE | INTRAMUSCULAR | Status: AC
  Filled 2021-08-14: qty 1

## 2021-08-14 MED ORDER — LACTATED RINGERS IV SOLN: INTRAVENOUS | Status: DC | PRN

## 2021-08-14 MED ORDER — ONDANSETRON HCL 4 MG/2ML IJ SOLN
INTRAMUSCULAR | Status: DC | PRN
  Administered 2021-08-14: 4 mg via INTRAVENOUS

## 2021-08-14 MED ORDER — LIDOCAINE 2% (20 MG/ML) 5 ML SYRINGE
INTRAMUSCULAR | Status: DC | PRN
  Administered 2021-08-14: 40 mg via INTRAVENOUS

## 2021-08-14 MED ORDER — HEPARIN 6000 UNIT IRRIGATION SOLUTION
Status: AC
  Filled 2021-08-14: qty 500

## 2021-08-14 MED ORDER — PROPOFOL 10 MG/ML IV BOLUS
INTRAVENOUS | Status: DC | PRN
  Administered 2021-08-14: 30 mg via INTRAVENOUS
  Administered 2021-08-14: 20 mg via INTRAVENOUS
  Administered 2021-08-14: 150 mg via INTRAVENOUS

## 2021-08-14 MED ORDER — ALUM & MAG HYDROXIDE-SIMETH 200-200-20 MG/5ML PO SUSP: 15.0000 mL | ORAL | Status: DC | PRN

## 2021-08-14 MED ORDER — LACTATED RINGERS IV SOLN: INTRAVENOUS | Status: DC

## 2021-08-14 MED ORDER — GLYCOPYRROLATE PF 0.2 MG/ML IJ SOSY
PREFILLED_SYRINGE | INTRAMUSCULAR | Status: DC | PRN
Start: 2021-08-14 — End: 2021-08-14
  Administered 2021-08-14: .2 mg via INTRAVENOUS

## 2021-08-14 MED ORDER — CEFAZOLIN SODIUM-DEXTROSE 2-4 GM/100ML-% IV SOLN: 2.0000 g | Freq: Three times a day (TID) | INTRAVENOUS | Status: DC

## 2021-08-14 MED ORDER — ACETAMINOPHEN 160 MG/5ML PO SOLN: 325.0000 mg | ORAL | Status: DC | PRN

## 2021-08-14 MED ORDER — BISACODYL 10 MG RE SUPP: 10.0000 mg | Freq: Every day | RECTAL | Status: DC | PRN

## 2021-08-14 MED ORDER — HEPARIN SODIUM (PORCINE) 1000 UNIT/ML IJ SOLN
INTRAMUSCULAR | Status: AC
  Filled 2021-08-14: qty 10

## 2021-08-14 MED ORDER — 0.9 % SODIUM CHLORIDE (POUR BTL) OPTIME
TOPICAL | Status: DC | PRN
  Administered 2021-08-14 (×2): 1000 mL

## 2021-08-14 MED ORDER — SODIUM CHLORIDE 0.9 % IV SOLN: 500.0000 mL | Freq: Once | INTRAVENOUS | Status: DC | PRN

## 2021-08-14 MED ORDER — FENTANYL CITRATE (PF) 250 MCG/5ML IJ SOLN
INTRAMUSCULAR | Status: AC
  Filled 2021-08-14: qty 5

## 2021-08-14 MED ORDER — DEXAMETHASONE SODIUM PHOSPHATE 10 MG/ML IJ SOLN
INTRAMUSCULAR | Status: AC
  Filled 2021-08-14: qty 1

## 2021-08-14 MED ORDER — PROTAMINE SULFATE 10 MG/ML IV SOLN
INTRAVENOUS | Status: AC
  Filled 2021-08-14: qty 5

## 2021-08-14 MED ORDER — ACETAMINOPHEN 325 MG PO TABS: 325.0000 mg | ORAL_TABLET | ORAL | Status: DC | PRN

## 2021-08-14 MED ORDER — PHENYLEPHRINE 40 MCG/ML (10ML) SYRINGE FOR IV PUSH (FOR BLOOD PRESSURE SUPPORT)
PREFILLED_SYRINGE | INTRAVENOUS | Status: DC | PRN
Start: 2021-08-14 — End: 2021-08-14
  Administered 2021-08-14: 160 ug via INTRAVENOUS
  Administered 2021-08-14: 80 ug via INTRAVENOUS

## 2021-08-14 MED ORDER — MIDAZOLAM HCL 2 MG/2ML IJ SOLN
INTRAMUSCULAR | Status: AC
  Filled 2021-08-14: qty 2

## 2021-08-14 SURGICAL SUPPLY — 55 items
BAG BANDED W/RUBBER/TAPE 36X54 (MISCELLANEOUS) ×2 IMPLANT
BALLN STERLING RX 6X30X80 (BALLOONS) ×2
BALLN STERLING RX 7X30X80 (BALLOONS) ×2
BALLOON STERLING RX 6X30X80 (BALLOONS) IMPLANT
BALLOON STERLING RX 7X30X80 (BALLOONS) IMPLANT
CANISTER SUCT 3000ML PPV (MISCELLANEOUS) ×2 IMPLANT
CHLORAPREP W/TINT 26 (MISCELLANEOUS) ×4 IMPLANT
CLIP LIGATING EXTRA MED SLVR (CLIP) IMPLANT
CLIP LIGATING EXTRA SM BLUE (MISCELLANEOUS) IMPLANT
COVER DOME SNAP 22 D (MISCELLANEOUS) ×2 IMPLANT
COVER PROBE W GEL 5X96 (DRAPES) ×2 IMPLANT
DERMABOND ADHESIVE PROPEN (GAUZE/BANDAGES/DRESSINGS) ×1
DERMABOND ADVANCED (GAUZE/BANDAGES/DRESSINGS) ×1
DERMABOND ADVANCED .7 DNX12 (GAUZE/BANDAGES/DRESSINGS) IMPLANT
DERMABOND ADVANCED .7 DNX6 (GAUZE/BANDAGES/DRESSINGS) ×1 IMPLANT
DRAPE FEMORAL ANGIO 80X135IN (DRAPES) ×2 IMPLANT
ELECT REM PT RETURN 9FT ADLT (ELECTROSURGICAL) ×2
ELECTRODE REM PT RTRN 9FT ADLT (ELECTROSURGICAL) ×1 IMPLANT
GAUZE SPONGE 4X4 12PLY STRL (GAUZE/BANDAGES/DRESSINGS) ×2 IMPLANT
GLOVE SURG POLYISO LF SZ8 (GLOVE) ×2 IMPLANT
GOWN STRL REUS W/ TWL LRG LVL3 (GOWN DISPOSABLE) ×2 IMPLANT
GOWN STRL REUS W/ TWL XL LVL3 (GOWN DISPOSABLE) ×1 IMPLANT
GOWN STRL REUS W/TWL LRG LVL3 (GOWN DISPOSABLE) ×2
GOWN STRL REUS W/TWL XL LVL3 (GOWN DISPOSABLE) ×1
GUIDEWIRE ENROUTE 0.014 (WIRE) ×3 IMPLANT
INTRODUCER KIT GALT 7CM (INTRODUCER) ×2
KIT BASIN OR (CUSTOM PROCEDURE TRAY) ×2 IMPLANT
KIT ENCORE 26 ADVANTAGE (KITS) ×2 IMPLANT
KIT INTRODUCER GALT 7 (INTRODUCER) ×1 IMPLANT
KIT TURNOVER KIT B (KITS) ×2 IMPLANT
NDL HYPO 25GX1X1/2 BEV (NEEDLE) IMPLANT
NEEDLE HYPO 25GX1X1/2 BEV (NEEDLE) IMPLANT
NS IRRIG 1000ML POUR BTL (IV SOLUTION) ×2 IMPLANT
PACK CAROTID (CUSTOM PROCEDURE TRAY) ×2 IMPLANT
PENCIL SMOKE EVACUATOR (MISCELLANEOUS) IMPLANT
POSITIONER HEAD DONUT 9IN (MISCELLANEOUS) ×2 IMPLANT
SET MICROPUNCTURE 5F STIFF (MISCELLANEOUS) ×2 IMPLANT
SHEATH AVANTI 11CM 5FR (SHEATH) IMPLANT
SHUNT CAROTID BYPASS 10 (VASCULAR PRODUCTS) IMPLANT
STENT TRANSCAROTID SYS 10X30 (Permanent Stent) ×1 IMPLANT
STENT TRANSCAROTID SYS 10X40 (Permanent Stent) ×1 IMPLANT
SUT MNCRL AB 4-0 PS2 18 (SUTURE) ×2 IMPLANT
SUT PROLENE 5 0 C 1 24 (SUTURE) ×2 IMPLANT
SUT PROLENE 6 0 BV (SUTURE) ×2 IMPLANT
SUT SILK 2 0 SH (SUTURE) ×2 IMPLANT
SUT VIC AB 3-0 SH 27 (SUTURE) ×1
SUT VIC AB 3-0 SH 27X BRD (SUTURE) ×1 IMPLANT
SYR 10ML LL (SYRINGE) ×6 IMPLANT
SYR 20ML LL LF (SYRINGE) ×2 IMPLANT
SYR CONTROL 10ML LL (SYRINGE) IMPLANT
SYSTEM TRANSCAROTID NEUROPRTCT (MISCELLANEOUS) ×1 IMPLANT
TOWEL GREEN STERILE (TOWEL DISPOSABLE) ×2 IMPLANT
TRANSCAROTID NEUROPROTECT SYS (MISCELLANEOUS) ×2
WATER STERILE IRR 1000ML POUR (IV SOLUTION) ×2 IMPLANT
WIRE BENTSON .035X145CM (WIRE) ×3 IMPLANT

## 2021-08-14 NOTE — Anesthesia Procedure Notes (Signed)
Arterial Line Insertion Start/End2/23/2023 7:00 AM, 08/14/2021 7:15 AM Performed by: Barrington Ellison, CRNA, CRNA  Patient location: Pre-op. Preanesthetic checklist: patient identified, IV checked and risks and benefits discussed Lidocaine 1% used for infiltration Left, radial was placed Catheter size: 20 G Hand hygiene performed  and maximum sterile barriers used  Allen's test indicative of satisfactory collateral circulation Attempts: 2 Procedure performed without using ultrasound guided technique. Following insertion, dressing applied and Biopatch. Post procedure assessment: normal  Patient tolerated the procedure well with no immediate complications.

## 2021-08-14 NOTE — Plan of Care (Signed)
  Problem: Education: Goal: Understanding of CV disease, CV risk reduction, and recovery process will improve Outcome: Progressing Goal: Individualized Educational Video(s) Outcome: Progressing   

## 2021-08-14 NOTE — Anesthesia Postprocedure Evaluation (Signed)
Anesthesia Post Note  Patient: Danny Hall  Procedure(s) Performed: Right Transcarotid Artery Revascularization (Right: Neck)     Patient location during evaluation: PACU Anesthesia Type: General Level of consciousness: awake and alert Pain management: pain level controlled Vital Signs Assessment: post-procedure vital signs reviewed and stable Respiratory status: spontaneous breathing, nonlabored ventilation, respiratory function stable and patient connected to nasal cannula oxygen Cardiovascular status: blood pressure returned to baseline and stable Postop Assessment: no apparent nausea or vomiting Anesthetic complications: no   No notable events documented.  Last Vitals:  Vitals:   08/14/21 1200 08/14/21 1400  BP: (!) 108/51 (!) 107/57  Pulse: (!) 52   Resp: 15   Temp: 36.5 C   SpO2: 99% 100%    Last Pain:  Vitals:   08/14/21 1442  TempSrc:   PainSc: 0-No pain                 Effie Berkshire

## 2021-08-14 NOTE — Interval H&P Note (Signed)
History and Physical Interval Note:  08/14/2021 7:18 AM  Danny Hall  has presented today for surgery, with the diagnosis of CAROTID STENOSIS.  The various methods of treatment have been discussed with the patient and family. After consideration of risks, benefits and other options for treatment, the patient has consented to  Procedure(s): Right Transcarotid Artery Revascularization (Right) as a surgical intervention.  The patient's history has been reviewed, patient examined, no change in status, stable for surgery.  I have reviewed the patient's chart and labs.  Questions were answered to the patient's satisfaction.     [name]

## 2021-08-14 NOTE — Transfer of Care (Signed)
Immediate Anesthesia Transfer of Care Note  Patient: Danny Hall  Procedure(s) Performed: Right Transcarotid Artery Revascularization (Right: Neck)  Patient Location: PACU  Anesthesia Type:General  Level of Consciousness: awake and alert   Airway & Oxygen Therapy: Patient Spontanous Breathing  Post-op Assessment: Report given to RN  Post vital signs: Reviewed and stable  Last Vitals:  Vitals Value Taken Time  BP 149/135 08/14/21 0920  Temp    Pulse 97 08/14/21 0923  Resp 14 08/14/21 0923  SpO2 97 % 08/14/21 0923  Vitals shown include unvalidated device data.  Last Pain:  Vitals:   08/14/21 0548  TempSrc:   PainSc: 0-No pain         Complications: No notable events documented.

## 2021-08-14 NOTE — Op Note (Signed)
DATE OF SERVICE: 08/14/2021  PATIENT:  Danny Hall  76 y.o. male  PRE-OPERATIVE DIAGNOSIS:  symptomatic right carotid artery stenosis  POST-OPERATIVE DIAGNOSIS:  Same  PROCEDURE:   Right transcarotid artery revascularization (TCAR)  SURGEON:  Surgeon(s) and Role:    * [name], MD - Primary  ASSISTANT: Leontine Locket, PA-C  An assistant was required to facilitate exposure and expedite the case.  ANESTHESIA:   general  EBL: minimal  BLOOD ADMINISTERED:none  DRAINS: none   LOCAL MEDICATIONS USED:  NONE  SPECIMEN:  none  COUNTS: confirmed correct.  TOURNIQUET:  none  PATIENT DISPOSITION:  PACU - hemodynamically stable.   Delay start of Pharmacological VTE agent (>24hrs) due to surgical blood loss or risk of bleeding: no  INDICATION FOR PROCEDURE: Danny Hall is a 76 y.o. male with symptomatic right carotid artery stenoiss. After careful discussion of risks, benefits, and alternatives the patient was offered TCAR. We specifically discussed risk of stroke, cranial nerve injury, hematoma. The patient understood and wished to proceed.  OPERATIVE FINDINGS: unremarkable TCAR. Large burden of soft atheroma in flow reversal filter.  DESCRIPTION OF PROCEDURE: After identification of the patient in the pre-operative holding area, the patient was transferred to the operating room. The patient was positioned supine on the operating room table. Anesthesia was induced. The right neck and groins were prepped and draped in standard fashion. A surgical pause was performed confirming correct patient, procedure, and operative location.  Using intraoperative ultrasound the course of the right common carotid artery was mapped on the skin.  A transverse incision was made between the sternal and clavicular heads of the sternocleidomastoid muscle, below the omohyoid. Following longitudinal division of the carotid sheath the jugular vein was partially skeletonized and retracted medially.  Once 3 cm of common carotid artery (CCA) were isolated, umbilical tape was placed around the proximal 1/3 of the CCA under direct vision. A 5-O Prolene suture was pre-placed in the anterior wall of the CCA, in a U stitch configuration,  close to the clavicle to facilitate hemostasis upon removal of the arterial sheath at completion of the TCAR procedure.   The contralateral common femoral vein (CFV) was accessed under ultrasound guidance, using standard Seldinger and micropuncture access technique. The venous return sheath was advanced into the CFV over the 0.035 wire provided. Blood was aspirated from the flow line followed by flushing of the Venous Sheath with heparinized saline. The Venous Sheath was secured to the patients skin with suture to maintain  optimal position in the vessel. Heparin was given to obtain a therapeutic activated clotting time >250 seconds prior to arterial access.   A 4-French non-stiffened ENHANCE Transcarotid / Peripheral Access set was used, puncturing the artery with the 21G needle through the pre-placed U stitch while holding gentle traction on the umbilical tape to stabilize and centralize the CCA within the incision. Careful attention was paid to the change in CCA shape when using the umbilical tape to control or lift the artery. The micropuncture wire was then advanced 3-4 cm into the CCA and, the 21G needle was removed. The micropuncture sheath was advanced 2-3 cm into the CCA and the wire and dilator were removed. Pulsatile backflow indicated correct positioning. The provided 0.035" J-tipped guidewire was inserted as close as possible to the bifurcation without engaging the lesion. After micropuncture sheath removal, the Transcarotid Arterial Sheath was advanced to the 2.5cm marker and the 0.035 wire and dilator were then removed. Arterial Sheath position was assessed under fluoroscopy  in two projections to ensure that the sheath tip was oriented coaxially in the  CCA. The Arterial Sheath was sutured to the patient with gentle forward tension. Blood was slowly aspirated followed by flushing with heparinized saline. No ingress of air bubbles through the passive hemostatic valve was observed. The stopcocks were closed. Traction applied to the CCA previously to facilitate access was gently released.  The Flow Controller was connected to the Transcarotid Arterial Sheath, prepared by passively allowing a column of arterial blood to fill the line and connected to the Venous Return Sheath. CCA inflow was occluded proximal to the arteriotomy with a vascular clamp to achieve active flow reversal. To confirm flow reversal, a saline bolus was delivered into the venous flow line on both High and Low flow settings of the Flow Controller. Angiograms were performed with slow injections of a small amount of contrast filling just past the lesion to minimize antegrade transmission of micro-bubbles.  Prior to lesion manipulation, heart rate (24PYK) and systolic BP (998-338SNKN) were managed upwards to optimize flow reversal and procedural neuroprotection. The lesion was crossed with an 0.014 ENROUTE guidewire and pre-dilation of the lesion was performed with a 6x30mm rapid exchange 0.014 compatible balloon catheter to 8 atmospheres for 10 seconds. Stenting was performed with an two 10x40mm ENROUTE Transcarotid stents, sized appropriately to the right CCA. The lesion was post dilated with a 7x41mm balloon. A total of 10 minutes of flow reversal was used.  AP and lateral angiograms (gentle contrast injections) were performed to confirm stent placement and arterial wall stent apposition. At [facility] case completion, antegrade flow was restored by releasing the clamp on the CCA then closing the NPS stopcocks to the flow lines. The Transcarotid Arterial Sheath was removed and the pre-closure suture was tied. Heparin reversal was employed and a drain was placed. The Venous Return Sheath was  removed and hemostasis was achieved with brief manual compression.   Upon completion of the case instrument and sharps counts were confirmed correct. The patient was transferred to the PACU in good condition. I was present for all portions of the procedure.  [name]. Stanford Breed, MD [facility] Phone Number: [phone] 08/14/2021 9:23 AM

## 2021-08-14 NOTE — Anesthesia Procedure Notes (Signed)
Procedure Name: Intubation Date/Time: 08/14/2021 7:46 AM Performed by: Barrington Ellison, CRNA Pre-anesthesia Checklist: Patient identified, Emergency Drugs available, Suction available and Patient being monitored Patient Re-evaluated:Patient Re-evaluated prior to induction Oxygen Delivery Method: Circle System Utilized Preoxygenation: Pre-oxygenation with 100% oxygen Induction Type: IV induction Ventilation: Mask ventilation without difficulty Laryngoscope Size: Mac and 4 Grade View: Grade II Tube type: Oral Tube size: 7.5 mm Number of attempts: 1 Airway Equipment and Method: Stylet and Oral airway Placement Confirmation: ETT inserted through vocal cords under direct vision, positive ETCO2 and breath sounds checked- equal and bilateral Secured at: 23 cm Tube secured with: Tape Dental Injury: Teeth and Oropharynx as per pre-operative assessment

## 2021-08-14 NOTE — Discharge Instructions (Signed)
   [facility]  Discharge Instructions   Carotid Surgery  Please refer to the following instructions for your post-procedure care. Your surgeon or physician assistant will discuss any changes with you.  Activity  You are encouraged to walk as much as you can. You can slowly return to normal activities but must avoid strenuous activity and heavy lifting until your doctor tell you it's okay. Avoid activities such as vacuuming or swinging a golf club. You can drive after one week if you are comfortable and you are no longer taking prescription pain medications. It is normal to feel tired for serval weeks after your surgery. It is also normal to have difficulty with sleep habits, eating, and bowel movements after surgery. These will go away with time.  Bathing/Showering  Shower daily after you go home. Do not soak in a bathtub, hot tub, or swim until the incision heals completely.  Incision Care  Shower every day. Clean your incision with mild soap and water. Pat the area dry with a clean towel. You do not need a bandage unless otherwise instructed. Do not apply any ointments or creams to your incision. You may have skin glue on your incision. Do not peel it off. It will come off on its own in about one week. Your incision may feel thickened and raised for several weeks after your surgery. This is normal and the skin will soften over time.   For Men Only: It's okay to shave around the incision but do not shave the incision itself for 2 weeks. It is common to have numbness under your chin that could last for several months.  Diet  Resume your normal diet. There are no special food restrictions following this procedure. A low fat/low cholesterol diet is recommended for all patients with vascular disease. In order to heal from your surgery, it is CRITICAL to get adequate nutrition. Your body requires vitamins, minerals, and protein. Vegetables are the best source of  vitamins and minerals. Vegetables also provide the perfect balance of protein. Processed food has little nutritional value, so try to avoid this.  Medications  Resume taking all of your medications unless your doctor or physician assistant tells you not to. If your incision is causing pain, you may take over-the- counter pain relievers such as acetaminophen (Tylenol). If you were prescribed a stronger pain medication, please be aware these medications can cause nausea and constipation. Prevent nausea by taking the medication with a snack or meal. Avoid constipation by drinking plenty of fluids and eating foods with a high amount of fiber, such as fruits, vegetables, and grains.   Do not take Tylenol if you are taking prescription pain medications.  Follow Up  Our office will schedule a follow up appointment 2-3 weeks following discharge.  Please call us immediately for any of the following conditions  . Increased pain, redness, drainage (pus) from your incision site. . Fever of 101 degrees or higher. . If you should develop stroke (slurred speech, difficulty swallowing, weakness on one side of your body, loss of vision) you should call 911 and go to the nearest emergency room. .  Reduce your risk of vascular disease:  . Stop smoking. If you would like help call [facility] at 1-800-QUIT-NOW (1-800-784-8669) or [place] at 336-586-4000. . Manage your cholesterol . Maintain a desired weight . Control your diabetes . Keep your blood pressure down .  If you have any questions, please call the office at [phone]. 

## 2021-08-14 NOTE — Progress Notes (Signed)
°  Day of Surgery Note    Subjective:  resting comfortably in the recovery room    Vitals:   08/14/21 0935 08/14/21 0950  BP: (!) 119/57 (!) 100/52  Pulse: (!) 50 (!) 53  Resp: 16 18  Temp:    SpO2: 99% 97%    Incisions:   right neck incision is clean and dry without hematoma.  Left groin small hematoma improved with manual pressure held. Neuro:  moving all extremities equally; tongue is midline    Assessment/Plan:  This is a 76 y.o. male who is s/p  Right TCAR  -pt doing well in recovery & neuro in tact -pt had small hematoma left groin but improved with manual pressure held. -to 4 east later today -continue statin/asa/plavix -probably discharge tomorrow if evening is uneventful   Leontine Locket, PA-C 08/14/2021 9:58 AM [phone]

## 2021-08-15 ENCOUNTER — Encounter (HOSPITAL_COMMUNITY): Payer: Self-pay | Admitting: Vascular Surgery

## 2021-08-15 ENCOUNTER — Other Ambulatory Visit (HOSPITAL_COMMUNITY): Payer: Self-pay

## 2021-08-15 LAB — BASIC METABOLIC PANEL
Anion gap: 7 (ref 5–15)
BUN: 22 mg/dL (ref 8–23)
CO2: 20 mmol/L — ABNORMAL LOW (ref 22–32)
Calcium: 8.5 mg/dL — ABNORMAL LOW (ref 8.9–10.3)
Chloride: 109 mmol/L (ref 98–111)
Creatinine, Ser: 0.98 mg/dL (ref 0.61–1.24)
GFR, Estimated: >60 mL/min (ref >60)
Glucose, Bld: 108 mg/dL — ABNORMAL HIGH (ref 70–99)
Potassium: 4.3 mmol/L (ref 3.5–5.1)
Sodium: 136 mmol/L (ref 135–145)

## 2021-08-15 LAB — CBC
HCT: 40.4 % (ref 39.0–52.0)
Hemoglobin: 13.1 g/dL (ref 13.0–17.0)
MCH: 27.8 pg (ref 26.0–34.0)
MCHC: 32.4 g/dL (ref 30.0–36.0)
MCV: 85.8 fL (ref 80.0–100.0)
Platelets: 176 10*3/uL (ref 150–400)
RBC: 4.71 MIL/uL (ref 4.22–5.81)
RDW: 13.9 % (ref 11.5–15.5)
WBC: 12.8 10*3/uL — ABNORMAL HIGH (ref 4.0–10.5)
nRBC: 0 % (ref 0.0–0.2)

## 2021-08-15 LAB — LIPID PANEL
Cholesterol: 138 mg/dL (ref 0–200)
HDL: 28 mg/dL — ABNORMAL LOW (ref >40)
LDL Cholesterol: 87 mg/dL (ref 0–99)
Total CHOL/HDL Ratio: 4.9 RATIO
Triglycerides: 116 mg/dL (ref <150)
VLDL: 23 mg/dL (ref 0–40)

## 2021-08-15 MED ORDER — OXYCODONE-ACETAMINOPHEN 5-325 MG PO TABS
1.0000 | ORAL_TABLET | Freq: Four times a day (QID) | ORAL | 0 refills | Status: DC | PRN
  Filled 2021-08-15: qty 10, 3d supply, fill #0

## 2021-08-15 MED ORDER — ATORVASTATIN CALCIUM 80 MG PO TABS
80.0000 mg | ORAL_TABLET | Freq: Every day | ORAL | 3 refills | Status: DC
  Filled 2021-08-15: qty 90, 90d supply, fill #0

## 2021-08-15 MED ORDER — EZETIMIBE 10 MG PO TABS
10.0000 mg | ORAL_TABLET | Freq: Every day | ORAL | 3 refills | Status: DC
  Filled 2021-08-15: qty 90, 90d supply, fill #0

## 2021-08-15 NOTE — Progress Notes (Addendum)
[facility] [place]  Subjective  -  no complaints moving independently   Objective (!) 117/54 (!) 58 98 F (36.7 C) (Oral) 18 93%  Intake/Output Summary (Last 24 hours) at 08/15/2021 3254 Last data filed at 08/15/2021 0417 Gross per 24 hour  Intake 2173.89 ml  Output 10 ml  Net 2163.89 ml    Right subclavian incision healing well without hematoma Left groin soft No neurologic deficits, no tongue deviation of facial droop Moving all 4 extremities Lungs non labored breathing   Assessment/Planning: POD # 1 TCAR for symptomatic TIA  No neuro deficits Incision healing well F/U in 4 weeks with carotid duplex Continue ASA, Plavix and Statin daily  Plan for discharge in stable condition       Roxy Horseman 08/15/2021 7:52 AM --  Laboratory Lab Results: Recent Labs    08/15/21 0430  WBC 12.8*  HGB 13.1  HCT 40.4  PLT 176   BMET Recent Labs    08/15/21 0430  NA 136  K 4.3  CL 109  CO2 20*  GLUCOSE 108*  BUN 22  CREATININE 0.98  CALCIUM 8.5*    COAG Lab Results  Component Value Date   INR 1.0 08/11/2021   No results found for: PTT

## 2021-08-15 NOTE — Progress Notes (Signed)
Nursing dc note  Patient alert and oriented. Verbalized understand dc instructions. Belongings given to patient. Toc meds to be delivered to [facility]

## 2021-08-15 NOTE — Progress Notes (Signed)
PHARMACIST LIPID MONITORING   Danny Hall is a 76 y.o. male admitted on 08/14/2021 with PVD.  Pharmacy has been consulted to optimize lipid-lowering therapy with the indication of secondary prevention for clinical ASCVD.  Recent Labs:  Lipid Panel (last 6 months):   Lab Results  Component Value Date   CHOL 138 08/15/2021   TRIG 116 08/15/2021   HDL 28 (L) 08/15/2021   CHOLHDL 4.9 08/15/2021   VLDL 23 08/15/2021   LDLCALC 87 08/15/2021    Hepatic function panel (last 6 months):   Lab Results  Component Value Date   AST 20 08/11/2021   ALT 23 08/11/2021   ALKPHOS 84 08/11/2021   BILITOT 0.5 08/11/2021    SCr (since admission):   Serum creatinine: 0.98 mg/dL 08/15/21 0430 Estimated creatinine clearance: 80 mL/min  Current therapy and lipid therapy tolerance Current lipid-lowering therapy: atorvastatin 40mg /d Documented or reported allergies or intolerances to lipid-lowering therapies (if applicable): None  Assessment:   Patient agrees with changes to lipid-lowering therapy  Plan:    1.Statin intensity (high intensity recommended for all patients regardless of the LDL):  Increase atorvastatin to 80mg /d  2.Add ezetimibe (if any one of the following):    -Add to try to get LDL at goal  3.Refer to lipid clinic:   No  4.Follow-up with:  Primary care [place]  5.Follow-up labs after discharge:  Changes in lipid therapy were made. Check a lipid panel in 8-12 weeks then annually.      Hildred Laser, PharmD Clinical Pharmacist **Pharmacist phone directory can now be found on [email or web address] ([email or web address]).  Listed under [place].

## 2021-08-17 NOTE — Discharge Summary (Signed)
[facility] Discharge Summary   Patient ID:  Danny Hall MRN: [id number] DOB/AGE: 10/30/1945 76 y.o.  Admit date: 08/14/2021 Discharge date: 08/15/21 Date of Surgery: 08/14/2021 Surgeon: Surgeon(s): [name], MD  Admission Diagnosis: Carotid stenosis [I65.29] Carotid artery stenosis, symptomatic, right [I65.21]  Discharge Diagnoses:  Carotid stenosis [I65.29] Carotid artery stenosis, symptomatic, right [I65.21]  Secondary Diagnoses: Past Medical History:  Diagnosis Date   Cancer [facility])    Carotid artery disease ([place])    Stroke ([place]) 07/22/2021   TIA    Procedure(s): Right Transcarotid Artery Revascularization  Discharged Condition: good  HPI: Danny Hall is a 76 y.o. male who presents to clinic for evaluation of recent TIA.  The patient reports left-sided numbness in the arm and leg followed by a visual disturbance.  He was found to have right ICA stenosis> 80% and was scheduled for right TCAR intervention.   Hospital Course:  Danny Hall is a 76 y.o. male is S/P  Procedure(s): Right Transcarotid Artery Revascularization No neurologic deficits post op.  Right neck incision healing well.  Continue ASA, Plavix and Statin daily.  F/U in 4 weeks with carotid duplex.   Significant Diagnostic Studies: CBC Lab Results  Component Value Date   WBC 12.8 (H) 08/15/2021   HGB 13.1 08/15/2021   HCT 40.4 08/15/2021   MCV 85.8 08/15/2021   PLT 176 08/15/2021    BMET    Component Value Date/Time   NA 136 08/15/2021 0430   K 4.3 08/15/2021 0430   CL 109 08/15/2021 0430   CO2 20 (L) 08/15/2021 0430   GLUCOSE 108 (H) 08/15/2021 0430   BUN 22 08/15/2021 0430   CREATININE 0.98 08/15/2021 0430   CALCIUM 8.5 (L) 08/15/2021 0430   GFRNONAA >60 08/15/2021 0430   COAG Lab Results  Component Value Date   INR 1.0 08/11/2021     Disposition:  Discharge to :Home Discharge Instructions     Call MD for:  redness, tenderness, or signs of  infection (pain, swelling, bleeding, redness, odor or green/yellow discharge around incision site)   Complete by: As directed    Call MD for:  severe or increased pain, loss or decreased feeling  in affected limb(s)   Complete by: As directed    Call MD for:  temperature >100.5   Complete by: As directed    Resume previous diet   Complete by: As directed       Allergies as of 08/15/2021   No Known Allergies      Medication List     TAKE these medications    aspirin 81 MG EC tablet Take 81 mg by mouth daily.   atorvastatin 80 MG tablet Commonly known as: LIPITOR Take 1 tablet (80 mg total) by mouth daily. What changed:  medication strength See the new instructions.   clopidogrel 75 MG tablet Commonly known as: Plavix Take 1 tablet (75 mg total) by mouth daily.   diclofenac Sodium 1 % Gel Commonly known as: VOLTAREN Apply 2 g topically daily as needed (leg pain).   ezetimibe 10 MG tablet Commonly known as: Zetia Take 1 tablet (10 mg total) by mouth daily.   nicotine 14 mg/24hr patch Commonly known as: NICODERM CQ - dosed in mg/24 hours Place 14 mg onto the skin daily.   nicotine 21 mg/24hr patch Commonly known as: NICODERM CQ - dosed in mg/24 hours Place 21 mg onto the skin daily.   nicotine 7 mg/24hr patch Commonly known as: NICODERM CQ -  dosed in mg/24 hr 7 mg daily.   oxyCODONE-acetaminophen 5-325 MG tablet Commonly known as: PERCOCET/ROXICET Take 1 tablet by mouth every 6 (six) hours as needed.       Verbal and written Discharge instructions given to the patient. Wound care per Discharge AVS  Follow-up Information     [name], MD Follow up in 4 week(s).   Specialties: Vascular Surgery, Interventional Cardiology Why: Office will call you to arrange your appt (sent) Contact information: [place] [id number] [phone]                 Signed: Roxy Horseman 08/17/2021, 9:33 AM --- For VQI Registry use  --- Instructions: Press F2 to tab through selections.  Delete question if not applicable.   Modified Rankin score at D/C (0-6): Rankin Score=0  IV medication needed for:  1. Hypertension: No 2. Hypotension: No  Post-op Complications: No  1. Post-op CVA or TIA: No  If yes: Event classification (right eye, left eye, right cortical, left cortical, verterobasilar, other):   If yes: Timing of event (intra-op, <6 hrs post-op, >=6 hrs post-op, unknown):   2. CN injury: No  If yes: CN  injuried   3. Myocardial infarction: No  If yes: Dx by (EKG or clinical, Troponin):   4.  CHF: No  5.  Dysrhythmia (new): No  6. Wound infection: No  7. Reperfusion symptoms: No  8. Return to OR: No  If yes: return to OR for (bleeding, neurologic, other CEA incision, other):   Discharge medications: Statin use:  No  for medical reason on Zeytia ASA use:  Yes Beta blocker use:  No  for medical reason not indicated ACE-Inhibitor use:  No  for medical reason not indicated P2Y12 Antagonist use: [ ]  None, [x ] Plavix, [ ]  Plasugrel, [ ]  Ticlopinine, [ ]  Ticagrelor, [ ]  Other, [ ]  No for medical reason, [ ]  Non-compliant, [ ]  Not-indicated Anti-coagulant use:  [ x] None, [ ]  Warfarin, [ ]  Rivaroxaban, [ ]  Dabigatran, [ ]  Other, [ ]  No for medical reason, [ ]  Non-compliant, [ ]  Not-indicated

## 2021-09-01 ENCOUNTER — Other Ambulatory Visit: Payer: Self-pay | Admitting: *Deleted

## 2021-09-01 DIAGNOSIS — I6521 Occlusion and stenosis of right carotid artery: Secondary | ICD-10-CM

## 2021-09-16 ENCOUNTER — Ambulatory Visit (HOSPITAL_COMMUNITY)
Admission: RE | Admit: 2021-09-16 | Discharge: 2021-09-16 | Disposition: A | Discharge status: Home / Self Care | Payer: No Typology Code available for payment source | Source: Ambulatory Visit | Attending: Vascular Surgery | Admitting: Vascular Surgery

## 2021-09-16 ENCOUNTER — Ambulatory Visit (INDEPENDENT_AMBULATORY_CARE_PROVIDER_SITE_OTHER): Payer: No Typology Code available for payment source | Admitting: Vascular Surgery

## 2021-09-16 ENCOUNTER — Other Ambulatory Visit: Payer: Self-pay

## 2021-09-16 VITALS — BP 146/81 | HR 91 | Temp 98.0°F | Ht 76.0 in | Wt 215.6 lb

## 2021-09-16 DIAGNOSIS — I6521 Occlusion and stenosis of right carotid artery: Secondary | ICD-10-CM

## 2021-09-16 DIAGNOSIS — I6523 Occlusion and stenosis of bilateral carotid arteries: Secondary | ICD-10-CM

## 2021-09-16 NOTE — Progress Notes (Signed)
[facility] [place] ?PROGRESS NOTE ? ?ASSESSMENT / PLAN: ?Danny Hall is a 76 y.o. male s/p R TCAR 08/14/21 for symptomatic carotid artery stenosis.  ? ?Recommend the following which can slow the progression of atherosclerosis and reduce the risk of major adverse cardiac / limb events:  ?Complete cessation from all tobacco products. ?Blood glucose control with goal A1c < 7%. ?Blood pressure control with goal blood pressure < 140/90 mmHg. ?Lipid reduction therapy with goal LDL-C <100 mg/dL (<70 if symptomatic from PAD).  ?Aspirin '81mg'$  PO QD.  ?Atorvastatin 40-'80mg'$  PO QD (or other "high intensity" statin therapy). ? ?Follow up with me in 1 year with repeat carotid duplex and ABI. ? ?SUBJECTIVE: ?No complaints. No neurologic symptoms. Has healed neck incision. ? ?OBJECTIVE: ?BP (!) 146/81 (BP Location: Right Arm, Patient Position: Sitting, Cuff Size: Normal)   Pulse 91   Temp 98 ?F (36.7 ?C)   Ht '6\' 4"'$  (1.93 m)   Wt 215 lb 9.6 oz (97.8 kg)   BMI 26.24 kg/m?  ? ?Constitutional: Well appearing. no acute distress. ?Cardiac: regular rate and rhythm. ?Pulmonary: unlabored ?Abdomen: soft ?Vascular: well healed right neck incision ? ? ?  Latest Ref Rng & Units 08/15/2021  ?  4:30 AM 08/11/2021  ?  9:15 AM  ?CBC  ?WBC 4.0 - 10.5 K/uL 12.8   8.8    ?Hemoglobin 13.0 - 17.0 g/dL 13.1   16.6    ?Hematocrit 39.0 - 52.0 % 40.4   51.4    ?Platelets 150 - 400 K/uL 176   223    ?  ? ? ?  Latest Ref Rng & Units 08/15/2021  ?  4:30 AM 08/11/2021  ?  9:15 AM  ?CMP  ?Glucose 70 - 99 mg/dL 108   92    ?BUN 8 - 23 mg/dL 22   18    ?Creatinine 0.61 - 1.24 mg/dL 0.98   0.82    ?Sodium 135 - 145 mmol/L 136   136    ?Potassium 3.5 - 5.1 mmol/L 4.3   4.2    ?Chloride 98 - 111 mmol/L 109   104    ?CO2 22 - 32 mmol/L 20   24    ?Calcium 8.9 - 10.3 mg/dL 8.5   9.6    ?Total Protein 6.5 - 8.1 g/dL  7.5    ?Total Bilirubin 0.3 - 1.2 mg/dL  0.5    ?Alkaline Phos 38 - 126 U/L  84    ?AST 15 - 41 U/L  20    ?ALT 0 - 44 U/L  23     ? ? ?CrCl cannot be calculated (Patient's most recent lab result is older than the maximum 21 days allowed.). ? ?Carotid duplex shows good technical result from R TCAR. Moderate (40-59%) L disease.  ? ?[name]. Stanford Breed, MD ?[facility] [place] ?Office Phone Number: [phone] ?09/16/2021 4:09 PM ? ? ? ?

## 2022-10-12 ENCOUNTER — Other Ambulatory Visit: Payer: Self-pay | Admitting: *Deleted

## 2022-10-12 DIAGNOSIS — I6523 Occlusion and stenosis of bilateral carotid arteries: Secondary | ICD-10-CM

## 2022-10-12 DIAGNOSIS — M79604 Pain in right leg: Secondary | ICD-10-CM

## 2022-10-19 NOTE — Progress Notes (Unsigned)
[facility] [place] PROGRESS NOTE  ASSESSMENT / PLAN: Danny Hall is a 77 y.o. male s/p R TCAR 08/14/21 for symptomatic carotid artery stenosis.   Recommend the following which can slow the progression of atherosclerosis and reduce the risk of major adverse cardiac / limb events:  Complete cessation from all tobacco products. Blood glucose control with goal A1c < 7%. Blood pressure control with goal blood pressure < 140/90 mmHg. Lipid reduction therapy with goal LDL-C <100 mg/dL (<09 if symptomatic from PAD).  Aspirin 81mg  PO QD.  Atorvastatin 40-80mg  PO QD (or other "high intensity" statin therapy).  Follow up with me in 1 year with repeat carotid duplex and ABI.  SUBJECTIVE: No complaints. No neurologic symptoms. Has healed neck incision.  OBJECTIVE: There were no vitals taken for this visit.  Constitutional: Well appearing. no acute distress. Cardiac: regular rate and rhythm. Pulmonary: unlabored Abdomen: soft Vascular: well healed right neck incision     Latest Ref Rng & Units 08/15/2021    4:30 AM 08/11/2021    9:15 AM  CBC  WBC 4.0 - 10.5 K/uL 12.8  8.8   Hemoglobin 13.0 - 17.0 g/dL 81.1  91.4   Hematocrit 39.0 - 52.0 % 40.4  51.4   Platelets 150 - 400 K/uL 176  223         Latest Ref Rng & Units 08/15/2021    4:30 AM 08/11/2021    9:15 AM  CMP  Glucose 70 - 99 mg/dL 782  92   BUN 8 - 23 mg/dL 22  18   Creatinine 9.56 - 1.24 mg/dL 2.13  0.86   Sodium 578 - 145 mmol/L 136  136   Potassium 3.5 - 5.1 mmol/L 4.3  4.2   Chloride 98 - 111 mmol/L 109  104   CO2 22 - 32 mmol/L 20  24   Calcium 8.9 - 10.3 mg/dL 8.5  9.6   Total Protein 6.5 - 8.1 g/dL  7.5   Total Bilirubin 0.3 - 1.2 mg/dL  0.5   Alkaline Phos 38 - 126 U/L  84   AST 15 - 41 U/L  20   ALT 0 - 44 U/L  23     CrCl cannot be calculated (Patient's most recent lab result is older than the maximum 21 days allowed.).  Carotid duplex shows good technical result from R TCAR.  Moderate (40-59%) L disease.   Rande Brunt. Lenell Antu, MD [facility] Phone Number: [phone] 10/19/2022 10:15 AM

## 2022-10-20 ENCOUNTER — Ambulatory Visit (HOSPITAL_COMMUNITY)
Admission: RE | Admit: 2022-10-20 | Discharge: 2022-10-20 | Disposition: A | Discharge status: Home / Self Care | Payer: No Typology Code available for payment source | Source: Ambulatory Visit | Attending: Vascular Surgery | Admitting: Vascular Surgery

## 2022-10-20 ENCOUNTER — Encounter: Payer: Self-pay | Admitting: Vascular Surgery

## 2022-10-20 ENCOUNTER — Ambulatory Visit (INDEPENDENT_AMBULATORY_CARE_PROVIDER_SITE_OTHER)
Admission: RE | Admit: 2022-10-20 | Discharge: 2022-10-20 | Disposition: A | Discharge status: Home / Self Care | Payer: No Typology Code available for payment source | Source: Ambulatory Visit | Attending: Vascular Surgery

## 2022-10-20 ENCOUNTER — Ambulatory Visit (INDEPENDENT_AMBULATORY_CARE_PROVIDER_SITE_OTHER): Payer: No Typology Code available for payment source | Admitting: Vascular Surgery

## 2022-10-20 VITALS — BP 147/76 | HR 74 | Temp 98.3°F | Resp 20 | Ht 76.0 in | Wt 213.9 lb

## 2022-10-20 DIAGNOSIS — I6523 Occlusion and stenosis of bilateral carotid arteries: Secondary | ICD-10-CM

## 2022-10-20 DIAGNOSIS — M79605 Pain in left leg: Secondary | ICD-10-CM | POA: Diagnosis not present

## 2022-10-20 DIAGNOSIS — I70212 Atherosclerosis of native arteries of extremities with intermittent claudication, left leg: Secondary | ICD-10-CM | POA: Diagnosis not present

## 2022-10-20 DIAGNOSIS — M79604 Pain in right leg: Secondary | ICD-10-CM | POA: Insufficient documentation

## 2022-10-20 LAB — VAS US ABI WITH/WO TBI
Left ABI: 0.57
Right ABI: 0.97

## 2022-10-30 ENCOUNTER — Other Ambulatory Visit: Payer: Self-pay

## 2022-10-30 DIAGNOSIS — I70212 Atherosclerosis of native arteries of extremities with intermittent claudication, left leg: Secondary | ICD-10-CM

## 2023-01-18 NOTE — Progress Notes (Unsigned)
[facility] [place]  ASSESSMENT / PLAN: 77 y.o. 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  Patient counseled patients with asymptomatic peripheral arterial disease or claudication have a 1-2% risk of developing chronic limb threatening ischemia, but a 15-30% risk of mortality in the next 5 years. Intervention should only be considered for medically optimized patients with disabling symptoms.   - best medical therapy as above - daily walking to and past the point of discomfort. Patient counseled to keep a log of exercise distance.  Follow up with me in 6 months with ABI to monitor progress.  CHIEF COMPLAINT: left leg pain  HISTORY OF PRESENT ILLNESS: Danny Hall is a 77 y.o. male who returns to clinic for evaluation of prior carotid stent.  He is also reporting continued claudication type symptoms in his left leg.  The patient reports he can get about 1/4 mile before his leg starts cramping.  After resting, his leg feels better and he can go again.  He can occasionally get through a grocery shop but not reliably.  He does have some pain that awakens him from sleep, but this is not continuous and only occurs occasionally.  He does not describe any ulceration of his feet.  01/19/23: Returns to clinic.  He has been compliant with walking therapy.  He has not stop smoking.  Overall he feels he has better.  He his exercise tolerance has improved significantly.  He can do a grocery shop without stopping.  He was able to walk around the [facility] without stopping.  Previously this was more symptomatic for him.   Past Medical History:  Diagnosis Date   Cancer [facility])    Carotid artery disease (HCC)    Stroke  (HCC) 07/22/2021   TIA    Past Surgical History:  Procedure Laterality Date   BACK SURGERY     HERNIA REPAIR     JOINT REPLACEMENT Left    hip   TRANSCAROTID ARTERY REVASCULARIZATION  Right 08/14/2021   Procedure: Right Transcarotid Artery Revascularization;  Surgeon: Leonie Douglas, MD;  Location: [facility] OR;  Service: Vascular;  Laterality: Right;    No family history on file.  Social History   Socioeconomic History   Marital status: Married    Spouse name: Not on file   Number of children: Not on file   Years of education: Not on file   Highest education level: Not on file  Occupational History   Not on file  Tobacco Use   Smoking status: Every Day    Current packs/day: 2.00    Types: Cigarettes   Smokeless tobacco: Never  Vaping Use   Vaping status: Never Used  Substance and Sexual Activity   Alcohol use: No   Drug use: No   Sexual activity: Not on file  Other Topics Concern   Not on file  Social History Narrative   Not on file   Social Determinants of Health   Financial Resource Strain: Not on file  Food Insecurity: Not on file  Transportation Needs: Not on file  Physical Activity: Not on file  Stress: Not on file  Social Connections: Not on file  Intimate Partner Violence: Not on file    No Known  Allergies  Current Outpatient Medications  Medication Sig Dispense Refill   aspirin 81 MG EC tablet Take 81 mg by mouth daily.     atorvastatin (LIPITOR) 40 MG tablet Take 1 tablet by mouth at bedtime.     diclofenac Sodium (VOLTAREN) 1 % GEL Apply 2 g topically daily as needed (leg pain).     No current facility-administered medications for this visit.    PHYSICAL EXAM There were no vitals filed for this visit.  Elderly man in no distress Regular rate and rhythm Unlabored breathing No palpable pedal pulses Neck incision well healed  PERTINENT LABORATORY AND RADIOLOGIC DATA  Most recent CBC    Latest Ref Rng & Units 08/15/2021    4:30 AM  08/11/2021    9:15 AM  CBC  WBC 4.0 - 10.5 K/uL 12.8  8.8   Hemoglobin 13.0 - 17.0 g/dL 16.1  09.6   Hematocrit 39.0 - 52.0 % 40.4  51.4   Platelets 150 - 400 K/uL 176  223      Most recent CMP    Latest Ref Rng & Units 08/15/2021    4:30 AM 08/11/2021    9:15 AM  CMP  Glucose 70 - 99 mg/dL 045  92   BUN 8 - 23 mg/dL 22  18   Creatinine 4.09 - 1.24 mg/dL 8.11  9.14   Sodium 782 - 145 mmol/L 136  136   Potassium 3.5 - 5.1 mmol/L 4.3  4.2   Chloride 98 - 111 mmol/L 109  104   CO2 22 - 32 mmol/L 20  24   Calcium 8.9 - 10.3 mg/dL 8.5  9.6   Total Protein 6.5 - 8.1 g/dL  7.5   Total Bilirubin 0.3 - 1.2 mg/dL  0.5   Alkaline Phos 38 - 126 U/L  84   AST 15 - 41 U/L  20   ALT 0 - 44 U/L  23     Renal function CrCl cannot be calculated (Patient's most recent lab result is older than the maximum 21 days allowed.).  No results found for: "HGBA1C"  LDL Cholesterol  Date Value Ref Range Status  08/15/2021 87 0 - 99 mg/dL Final    Comment:           Total Cholesterol/HDL:CHD Risk Coronary Heart Disease Risk Table                     Men   Women  1/2 Average Risk   3.4   3.3  Average Risk       5.0   4.4  2 X Average Risk   9.6   7.1  3 X Average Risk  23.4   11.0        Use the calculated Patient Ratio above and the CHD Risk Table to determine the patient's CHD Risk.        ATP III CLASSIFICATION (LDL):  <100     mg/dL   Optimal  956-213  mg/dL   Near or Above                    Optimal  130-159  mg/dL   Borderline  086-578  mg/dL   High  >469     mg/dL   Very High Performed at [facility], [address] [place], [place] [address]      +-------+-----------+-----------+------------+------------+  ABI/TBIToday's ABIToday's TBIPrevious ABIPrevious TBI  +-------+-----------+-----------+------------+------------+  Right 0.98  0.59       0.97        0.59          +-------+-----------+-----------+------------+------------+  Left  0.52       .39         0.57        0.33          +-------+-----------+-----------+------------+------------+     Rande Brunt. Lenell Antu, MD [facility] Phone Number: [phone] 01/18/2023 4:39 PM   Total time spent on preparing this encounter including chart review, data review, collecting history, examining the patient, coordinating care for this established patient, 30 minutes.  Portions of this report may have been transcribed using voice recognition software.  Every effort has been made to ensure accuracy; however, inadvertent computerized transcription errors may still be present.

## 2023-01-19 ENCOUNTER — Ambulatory Visit (INDEPENDENT_AMBULATORY_CARE_PROVIDER_SITE_OTHER): Payer: No Typology Code available for payment source | Admitting: Vascular Surgery

## 2023-01-19 ENCOUNTER — Ambulatory Visit (HOSPITAL_COMMUNITY)
Admission: RE | Admit: 2023-01-19 | Discharge: 2023-01-19 | Disposition: A | Discharge status: Home / Self Care | Payer: No Typology Code available for payment source | Source: Ambulatory Visit | Attending: Vascular Surgery | Admitting: Vascular Surgery

## 2023-01-19 ENCOUNTER — Encounter: Payer: Self-pay | Admitting: Vascular Surgery

## 2023-01-19 VITALS — BP 118/73 | HR 78 | Temp 97.9°F | Resp 20 | Ht 76.0 in | Wt 210.0 lb

## 2023-01-19 DIAGNOSIS — Z8673 Personal history of transient ischemic attack (TIA), and cerebral infarction without residual deficits: Secondary | ICD-10-CM | POA: Insufficient documentation

## 2023-01-19 DIAGNOSIS — Z95828 Presence of other vascular implants and grafts: Secondary | ICD-10-CM

## 2023-01-19 DIAGNOSIS — I70212 Atherosclerosis of native arteries of extremities with intermittent claudication, left leg: Secondary | ICD-10-CM | POA: Insufficient documentation

## 2023-01-19 LAB — VAS US ABI WITH/WO TBI
Left ABI: 0.52
Right ABI: 0.98

## 2023-01-21 ENCOUNTER — Other Ambulatory Visit: Payer: Self-pay

## 2023-01-21 DIAGNOSIS — I70212 Atherosclerosis of native arteries of extremities with intermittent claudication, left leg: Secondary | ICD-10-CM

## 2023-07-06 ENCOUNTER — Encounter (HOSPITAL_COMMUNITY): Payer: No Typology Code available for payment source

## 2023-07-06 ENCOUNTER — Ambulatory Visit: Payer: No Typology Code available for payment source | Admitting: Vascular Surgery

## 2023-07-20 ENCOUNTER — Ambulatory Visit: Payer: No Typology Code available for payment source | Admitting: Vascular Surgery

## 2023-07-20 ENCOUNTER — Encounter (HOSPITAL_COMMUNITY): Payer: No Typology Code available for payment source

## 2024-03-27 ENCOUNTER — Other Ambulatory Visit (HOSPITAL_COMMUNITY): Payer: Self-pay
